# Patient Record
Sex: Male | Born: 1975
Health system: Southern US, Community
[De-identification: ages and names within clinical notes are randomized; demographics above are authoritative.]

---

## 2014-10-01 DIAGNOSIS — M791 Myalgia: Secondary | ICD-10-CM | POA: Insufficient documentation

## 2014-10-01 DIAGNOSIS — R112 Nausea with vomiting, unspecified: Secondary | ICD-10-CM | POA: Insufficient documentation

## 2014-10-01 DIAGNOSIS — Z720 Tobacco use: Secondary | ICD-10-CM | POA: Insufficient documentation

## 2014-10-01 DIAGNOSIS — R197 Diarrhea, unspecified: Secondary | ICD-10-CM | POA: Insufficient documentation

## 2014-10-02 ENCOUNTER — Encounter (HOSPITAL_BASED_OUTPATIENT_CLINIC_OR_DEPARTMENT_OTHER): Payer: Self-pay | Admitting: Emergency Medicine

## 2014-10-02 ENCOUNTER — Emergency Department (HOSPITAL_BASED_OUTPATIENT_CLINIC_OR_DEPARTMENT_OTHER)
Admission: EM | Admit: 2014-10-02 | Discharge: 2014-10-02 | Disposition: A | Discharge status: Home / Self Care | Payer: Self-pay | Attending: Emergency Medicine | Admitting: Emergency Medicine

## 2014-10-02 DIAGNOSIS — R112 Nausea with vomiting, unspecified: Secondary | ICD-10-CM

## 2014-10-02 DIAGNOSIS — R197 Diarrhea, unspecified: Secondary | ICD-10-CM

## 2014-10-02 MED ORDER — ONDANSETRON 8 MG PO TBDP
8.0000 mg | ORAL_TABLET | Freq: Once | ORAL | Status: AC
  Administered 2014-10-02: 8 mg via ORAL
  Filled 2014-10-02: qty 1

## 2014-10-02 MED ORDER — KETOROLAC TROMETHAMINE 60 MG/2ML IM SOLN
60.0000 mg | Freq: Once | INTRAMUSCULAR | Status: AC
  Administered 2014-10-02: 60 mg via INTRAMUSCULAR
  Filled 2014-10-02: qty 2

## 2014-10-02 MED ORDER — DICYCLOMINE HCL 10 MG/ML IM SOLN
20.0000 mg | Freq: Once | INTRAMUSCULAR | Status: AC
  Administered 2014-10-02: 20 mg via INTRAMUSCULAR
  Filled 2014-10-02: qty 2

## 2014-10-02 NOTE — ED Notes (Addendum)
Patient reports body aches which began around 2100 tonight.  Reports diarrhea, vomiting x 2, and chills.

## 2014-10-02 NOTE — ED Provider Notes (Signed)
CSN: [id number]     Arrival date & time 10/01/14  2351 History   First MD Initiated Contact with Patient 10/02/14 0105     Chief Complaint  Patient presents with  . Generalized Body Aches     (Consider location/radiation/quality/duration/timing/severity/associated sxs/prior Treatment) Patient is a 39 y.o. male presenting with vomiting. The history is provided by the patient.  Emesis Severity:  Mild Timing:  Intermittent Quality:  Stomach contents Progression:  Unchanged Chronicity:  New Recent urination:  Normal Context: not post-tussive   Relieved by:  Nothing Worsened by:  Nothing tried Ineffective treatments:  None tried Associated symptoms: diarrhea and myalgias   Associated symptoms: no fever   Diarrhea:    Quality:  Watery   Severity:  Moderate   Timing:  Intermittent   Progression:  Unchanged Risk factors: sick contacts   Risk factors: no alcohol use     History reviewed. No pertinent past medical history. History reviewed. No pertinent past surgical history. History reviewed. No pertinent family history. History  Substance Use Topics  . Smoking status: Current Every Day Smoker -- 5.00 packs/day    Types: Cigars  . Smokeless tobacco: Not on file  . Alcohol Use: No    Review of Systems  Constitutional: Negative for fever.  Gastrointestinal: Positive for vomiting and diarrhea.  Musculoskeletal: Positive for myalgias.  All other systems reviewed and are negative.     Allergies  Review of patient's allergies indicates no known allergies.  Home Medications   Prior to Admission medications   Not on File   BP 145/92 mmHg  Pulse 84  Temp(Src) 99.1 F (37.3 C) (Oral)  Resp 16  Ht 5\' 10"  (1.778 m)  Wt 140 lb (63.504 kg)  BMI 20.09 kg/m2  SpO2 100% Physical Exam  Constitutional: He is oriented to person, place, and time. He appears well-developed and well-nourished. No distress.  HENT:  Head: Normocephalic and atraumatic.  Mouth/Throat: Oropharynx  is clear and moist.  Eyes: Conjunctivae are normal. Pupils are equal, round, and reactive to light.  Neck: Normal range of motion. Neck supple.  Cardiovascular: Normal rate, regular rhythm and intact distal pulses.   Pulmonary/Chest: Effort normal and breath sounds normal. No respiratory distress. He has no wheezes. He has no rales.  Abdominal: Soft. Bowel sounds are normal. He exhibits no mass. There is no tenderness. There is no rebound and no guarding.  Musculoskeletal: Normal range of motion.  Neurological: He is alert and oriented to person, place, and time.  Skin: Skin is warm and dry.  Psychiatric: He has a normal mood and affect.    ED Course  Procedures (including critical care time) Labs Review Labs Reviewed - No data to display  Imaging Review No results found.   EKG Interpretation None      MDM   Final diagnoses:  None   Viral n/v/d.  Treated and PO challenged successfully.  Follow up with your PMD.  Strict return precautions given   Anacarolina Evelyn, MD 10/02/14 0159

## 2014-10-02 NOTE — ED Notes (Signed)
C/o generalized body aches x 5 hours

## 2015-09-19 ENCOUNTER — Emergency Department (HOSPITAL_BASED_OUTPATIENT_CLINIC_OR_DEPARTMENT_OTHER): Payer: Self-pay

## 2015-09-19 ENCOUNTER — Encounter (HOSPITAL_BASED_OUTPATIENT_CLINIC_OR_DEPARTMENT_OTHER): Payer: Self-pay

## 2015-09-19 ENCOUNTER — Emergency Department (HOSPITAL_BASED_OUTPATIENT_CLINIC_OR_DEPARTMENT_OTHER)
Admission: EM | Admit: 2015-09-19 | Discharge: 2015-09-19 | Disposition: A | Discharge status: Home / Self Care | Payer: Self-pay | Attending: Emergency Medicine | Admitting: Emergency Medicine

## 2015-09-19 DIAGNOSIS — Z9889 Other specified postprocedural states: Secondary | ICD-10-CM

## 2015-09-19 DIAGNOSIS — F17210 Nicotine dependence, cigarettes, uncomplicated: Secondary | ICD-10-CM | POA: Insufficient documentation

## 2015-09-19 DIAGNOSIS — J039 Acute tonsillitis, unspecified: Secondary | ICD-10-CM | POA: Insufficient documentation

## 2015-09-19 LAB — CBC WITH DIFFERENTIAL/PLATELET
Basophils Absolute: 0 10*3/uL (ref 0.0–0.1)
Basophils Relative: 0 %
EOS ABS: 0.2 10*3/uL (ref 0.0–0.7)
Eosinophils Relative: 3 %
HCT: 41.1 % (ref 39.0–52.0)
Hemoglobin: 14.7 g/dL (ref 13.0–17.0)
Lymphocytes Relative: 26 %
Lymphs Abs: 1.7 10*3/uL (ref 0.7–4.0)
MCH: 32.4 pg (ref 26.0–34.0)
MCHC: 35.8 g/dL (ref 30.0–36.0)
MCV: 90.5 fL (ref 78.0–100.0)
Monocytes Absolute: 0.8 10*3/uL (ref 0.1–1.0)
Monocytes Relative: 12 %
Neutro Abs: 3.9 10*3/uL (ref 1.7–7.7)
Neutrophils Relative %: 59 %
PLATELETS: 147 10*3/uL — AB (ref 150–400)
RBC: 4.54 MIL/uL (ref 4.22–5.81)
RDW: 12.4 % (ref 11.5–15.5)
WBC: 6.5 10*3/uL (ref 4.0–10.5)

## 2015-09-19 LAB — RAPID STREP SCREEN (MED CTR MEBANE ONLY): STREPTOCOCCUS, GROUP A SCREEN (DIRECT): NEGATIVE

## 2015-09-19 LAB — BASIC METABOLIC PANEL
Anion gap: 4 — ABNORMAL LOW (ref 5–15)
BUN: 11 mg/dL (ref 6–20)
CALCIUM: 8.8 mg/dL — AB (ref 8.9–10.3)
CO2: 30 mmol/L (ref 22–32)
Chloride: 106 mmol/L (ref 101–111)
Creatinine, Ser: 1.67 mg/dL — ABNORMAL HIGH (ref 0.61–1.24)
GFR calc Af Amer: 57 mL/min — ABNORMAL LOW (ref >60)
GFR, EST NON AFRICAN AMERICAN: 49 mL/min — AB (ref >60)
Glucose, Bld: 87 mg/dL (ref 65–99)
POTASSIUM: 4.1 mmol/L (ref 3.5–5.1)
SODIUM: 140 mmol/L (ref 135–145)

## 2015-09-19 MED ORDER — CLINDAMYCIN HCL 300 MG PO CAPS: 300.0000 mg | ORAL_CAPSULE | Freq: Three times a day (TID) | ORAL | Status: DC

## 2015-09-19 MED ORDER — IOHEXOL 300 MG/ML  SOLN
60.0000 mL | Freq: Once | INTRAMUSCULAR | Status: AC | PRN
  Administered 2015-09-19: 60 mL via INTRAVENOUS

## 2015-09-19 MED ORDER — IBUPROFEN 800 MG PO TABS
800.0000 mg | ORAL_TABLET | Freq: Once | ORAL | Status: AC
  Administered 2015-09-19: 800 mg via ORAL
  Filled 2015-09-19: qty 1

## 2015-09-19 MED ORDER — IBUPROFEN 600 MG PO TABS: 600.0000 mg | ORAL_TABLET | Freq: Four times a day (QID) | ORAL | Status: DC | PRN

## 2015-09-19 NOTE — ED Notes (Signed)
States began having a sore throat and left ear pain since Monday, hard to swallow at times, able to tolerate PO fluids and some soft foods. Throat appears red and more swelling noted at left posterior area

## 2015-09-19 NOTE — ED Provider Notes (Signed)
CSN: [id number]     Arrival date & time 09/19/15  1033 History   First MD Initiated Contact with Patient 09/19/15 1100     Chief Complaint  Patient presents with  . Sore Throat  . Otalgia    HPI   Kyle Curtis is a 40 y.o. male with no pertinent PMH who presents to the ED with left sided throat and ear pain, which he states started on Monday and has been constant since that time. He reports swallowing exacerbates his pain, though notes he has been able to swallow and has not had difficulty handling his secretions. He has tried theraflu at home with no significant symptom relief. He denies fever, chills, congestion, cough. He states he has a history of tonsillar abscess, and notes his symptoms do not feel as severe.   History reviewed. No pertinent past medical history. History reviewed. No pertinent past surgical history. No family history on file. Social History  Substance Use Topics  . Smoking status: Current Every Day Smoker -- 5.00 packs/day    Types: Cigars  . Smokeless tobacco: None  . Alcohol Use: No     Review of Systems  Constitutional: Negative for fever and chills.  HENT: Positive for ear pain and sore throat. Negative for drooling and trouble swallowing.   Respiratory: Negative for shortness of breath.       Allergies  Review of patient's allergies indicates no known allergies.  Home Medications   Prior to Admission medications   Medication Sig Start Date End Date Taking? Authorizing Provider  clindamycin (CLEOCIN) 300 MG capsule Take 1 capsule (300 mg total) by mouth 3 (three) times daily. 09/19/15   Mady Gemma, PA-C  ibuprofen (ADVIL,MOTRIN) 600 MG tablet Take 1 tablet (600 mg total) by mouth every 6 (six) hours as needed. 09/19/15   Mady Gemma, PA-C    BP 152/103 mmHg  Pulse 62  Temp(Src) 98.2 F (36.8 C) (Oral)  Resp 16  Ht  (1.778 m)  Wt 61.236 kg  BMI 19.37 kg/m2  SpO2 100% Physical Exam  Constitutional: He is oriented  to person, place, and time. He appears well-developed and well-nourished. No distress.  HENT:  Head: Normocephalic and atraumatic.  Right Ear: Hearing, tympanic membrane, external ear and ear canal normal. Tympanic membrane is not erythematous.  Left Ear: Hearing, tympanic membrane, external ear and ear canal normal. Tympanic membrane is not erythematous.  Nose: Nose normal.  Mouth/Throat: Uvula is midline and mucous membranes are normal. Posterior oropharyngeal erythema present. No oropharyngeal exudate.  Left sided tonsillar hypertrophy and erythema.   Eyes: Conjunctivae, EOM and lids are normal. Pupils are equal, round, and reactive to light. Right eye exhibits no discharge. Left eye exhibits no discharge. No scleral icterus.  Neck: Normal range of motion. Neck supple.  Cardiovascular: Normal rate, regular rhythm, normal heart sounds, intact distal pulses and normal pulses.   Pulmonary/Chest: Effort normal and breath sounds normal. No respiratory distress. He has no wheezes. He has no rales.  Abdominal: Soft. Normal appearance and bowel sounds are normal. He exhibits no distension and no mass. There is no tenderness. There is no rigidity, no rebound and no guarding.  Musculoskeletal: Normal range of motion. He exhibits no edema or tenderness.  Neurological: He is alert and oriented to person, place, and time.  Skin: Skin is warm, dry and intact. No rash noted. He is not diaphoretic. No erythema. No pallor.  Psychiatric: He has a normal mood and affect. His speech  is normal and behavior is normal.  Nursing note and vitals reviewed.   ED Course  Procedures (including critical care time)  Labs Review Labs Reviewed  CBC WITH DIFFERENTIAL/PLATELET - Abnormal; Notable for the following:    Platelets 147 (*)    All other components within normal limits  BASIC METABOLIC PANEL - Abnormal; Notable for the following:    Creatinine, Ser 1.67 (*)    Calcium 8.8 (*)    GFR calc non Af Amer 49 (*)     GFR calc Af Amer 57 (*)    Anion gap 4 (*)    All other components within normal limits  RAPID STREP SCREEN (NOT AT [facility])  CULTURE, GROUP A STREP [facility])    Imaging Review Ct Soft Tissue Neck W Contrast  09/19/2015  CLINICAL DATA:  History of tonsillar inflammation. History of peritonsillar abscess drainage. Sore throat with LEFT ear pain. EXAM: CT NECK WITH CONTRAST TECHNIQUE: Multidetector CT imaging of the neck was performed using the standard protocol following the bolus administration of intravenous contrast. CONTRAST:  60mL OMNIPAQUE IOHEXOL 300 MG/ML  SOLN COMPARISON:  None. FINDINGS: Pharynx and larynx: LEFT palatine tonsillar enlargement. LEFT greater than RIGHT nasopharyngeal adenoidal enlargement. Findings consistent with tonsillitis and adenoidal inflammation. No tonsillar abscess. No peritonsillar abscess. Parapharyngeal fat is preserved. Salivary glands: Normal. Thyroid: Normal. Lymph nodes: No pathologic adenopathy. Vascular: Premature for age atherosclerosis RIGHT carotid bifurcation. Calcified and soft plaque without flow limiting stenosis. No similar findings on the LEFT. Limited intracranial: Negative. Visualized orbits: No abnormality. Mastoids and visualized paranasal sinuses: No acute sinus or mastoid disease. Skeleton: Cervical spondylosis. Central protrusion suspected at C4-C5. Upper chest: No lung nodule or air cyst formation. IMPRESSION: Findings consistent with left-sided tonsillitis and adenitis. No tonsillar or peritonsillar abscess. Premature atherosclerosis at the RIGHT carotid bifurcation. No flow-limiting stenosis, but calcific and soft plaque indicates increased risk for cerebral infarction. Electronically Signed   By: Elsie Stain M.D.   On: 09/19/2015 13:19   I have personally reviewed and evaluated these images and lab results as part of my medical decision-making.   EKG Interpretation None      MDM   Final diagnoses:  Tonsillitis    40 year old male  presents with left sided sore throat and ear pain since Monday. Patient is afebrile. Vital signs stable. TMs clear bilaterally. Left tonsillar erythema and hypertrophy present on exam. Patient handling secretions well. CBC negative for leukocytosis or anemia. BMP remarkable for creatinine 1.67. Rapid strep negative. Strep culture ordered. CT soft tissue neck remarkable for left-sided tonsillitis and adenitis, no tonsillar or peritonsillar abscess, premature atherosclerosis of the right carotid bifurcation. Spoke with patient regarding results. Will treat with antibiotic given unilateral tonsillitis. Patient to follow up with PCP for further evaluation and management of renal function and atherosclerosis. Return precautions discussed. Patient verbalizes his understanding and is in agreement with plan.  BP 152/103 mmHg  Pulse 62  Temp(Src) 98.2 F (36.8 C) (Oral)  Resp 16  Ht  (1.778 m)  Wt 61.236 kg  BMI 19.37 kg/m2  SpO2 100%    Mady Gemma, PA-C 09/19/15 1614  Rolan Bucco, MD 09/20/15 1525

## 2015-09-19 NOTE — ED Notes (Addendum)
Patient here with earache and sore throat x 1 week, reports that he feels as if his throat is swollen. No distress. Left tonsil swelling and redness noted

## 2015-09-19 NOTE — ED Notes (Signed)
Saline Lock placed rt ac for CT procedure

## 2015-09-19 NOTE — Discharge Instructions (Signed)
1. Medications: clindamycin, ibuprofen, usual home medications 2. Treatment: rest, drink plenty of fluids 3. Follow Up: please followup with your primary doctor for discussion of your diagnoses and further evaluation after today's visit; if you do not have a primary care doctor use the resource guide provided to find one; please return to the ER for high fever, difficulty swallowing or handling your secretions, new or worsening symptoms   Tonsillitis Tonsillitis is an infection of the throat that causes the tonsils to become red, tender, and swollen. Tonsils are collections of lymphoid tissue at the back of the throat. Each tonsil has crevices (crypts). Tonsils help fight nose and throat infections and keep infection from spreading to other parts of the body for the first 18 months of life.  CAUSES Sudden (acute) tonsillitis is usually caused by infection with streptococcal bacteria. Long-lasting (chronic) tonsillitis occurs when the crypts of the tonsils become filled with pieces of food and bacteria, which makes it easy for the tonsils to become repeatedly infected. SYMPTOMS  Symptoms of tonsillitis include:  A sore throat, with possible difficulty swallowing.  White patches on the tonsils.  Fever.  Tiredness.  New episodes of snoring during sleep, when you did not snore before.  Small, foul-smelling, yellowish-white pieces of material (tonsilloliths) that you occasionally cough up or spit out. The tonsilloliths can also cause you to have bad breath. DIAGNOSIS Tonsillitis can be diagnosed through a physical exam. Diagnosis can be confirmed with the results of lab tests, including a throat culture. TREATMENT  The goals of tonsillitis treatment include the reduction of the severity and duration of symptoms and prevention of associated conditions. Symptoms of tonsillitis can be improved with the use of steroids to reduce the swelling. Tonsillitis caused by bacteria can be treated with  antibiotic medicines. Usually, treatment with antibiotic medicines is started before the cause of the tonsillitis is known. However, if it is determined that the cause is not bacterial, antibiotic medicines will not treat the tonsillitis. If attacks of tonsillitis are severe and frequent, your health care provider may recommend surgery to remove the tonsils (tonsillectomy). HOME CARE INSTRUCTIONS   Rest as much as possible and get plenty of sleep.  Drink plenty of fluids. While the throat is very sore, eat soft foods or liquids, such as sherbet, soups, or instant breakfast drinks.  Eat frozen ice pops.  Gargle with a warm or cold liquid to help soothe the throat. Mix 1/4 teaspoon of salt and 1/4 teaspoon of baking soda in 8 oz of water. SEEK MEDICAL CARE IF:   Large, tender lumps develop in your neck.  A rash develops.  A green, yellow-brown, or bloody substance is coughed up.  You are unable to swallow liquids or food for 24 hours.  You notice that only one of the tonsils is swollen. SEEK IMMEDIATE MEDICAL CARE IF:   You develop any new symptoms such as vomiting, severe headache, stiff neck, chest pain, or trouble breathing or swallowing.  You have severe throat pain along with drooling or voice changes.  You have severe pain, unrelieved with recommended medications.  You are unable to fully open the mouth.  You develop redness, swelling, or severe pain anywhere in the neck.  You have a fever. MAKE SURE YOU:   Understand these instructions.  Will watch your condition.  Will get help right away if you are not doing well or get worse.   This information is not intended to replace advice given to you by your health  care provider. Make sure you discuss any questions you have with your health care provider.   Document Released: 04/19/2005 Document Revised: 07/31/2014 Document Reviewed: 12/27/2012 Elsevier Interactive Patient Education 2016 [employer].   Emergency  Department Resource Guide 1) Find a Doctor and Pay Out of Pocket Although you won't have to find out who is covered by your insurance plan, it is a good idea to ask around and get recommendations. You will then need to call the office and see if the doctor you have chosen will accept you as a new patient and what types of options they offer for patients who are self-pay. Some doctors offer discounts or will set up payment plans for their patients who do not have insurance, but you will need to ask so you aren't surprised when you get to your appointment.  2) Contact Your Local Health Department Not all health departments have doctors that can see patients for sick visits, but many do, so it is worth a call to see if yours does. If you don't know where your local health department is, you can check in your phone book. The CDC also has a tool to help you locate your state's health department, and many state websites also have listings of all of their local health departments.  3) Find a Walk-in Clinic If your illness is not likely to be very severe or complicated, you may want to try a walk in clinic. These are popping up all over the country in pharmacies, drugstores, and shopping centers. They're usually staffed by nurse practitioners or physician assistants that have been trained to treat common illnesses and complaints. They're usually fairly quick and inexpensive. However, if you have serious medical issues or chronic medical problems, these are probably not your best option.  No Primary Care Doctor: - Call Health Connect at  [phone] - they can help you locate a primary care doctor that  accepts your insurance, provides certain services, etc. - Physician Referral Service- [phone]  Chronic Pain Problems: Organization         Address  Phone   Notes  [facility]  [phone] Patients need to be referred by their primary care doctor.   Medication  Assistance: Organization         Address  Phone   Notes  [facility] Medication [facility] [address] [place] [place], [place] [address] [phone] --Must be a resident of [facility] -- Must have NO insurance coverage whatsoever (no Medicaid/ Medicare, etc.) -- The pt. MUST have a primary care doctor that directs their care regularly and follows them in the community   [facility]  [phone]   [employer]  [phone]    Agencies that provide inexpensive medical care: Organization         Address  Phone   Notes  [facility]  [phone]   [facility]    [phone]   [facility] [address] [place], [place] [address] [phone]   [facility] [address] [place], [place] [phone]   Planned Parenthood    [phone]   [facility]    [phone]   Community Health and [facility]  [address], [place] Phone:  [phone], Fax:  [phone] Hours of Operation:  9 am - 6 pm, M-F.  Also accepts Medicaid/Medicare and self-pay.  [facility]  [address], [place] Phone: [phone], Fax: [phone]. Hours of Operation:  8:30 am - 5:30 pm, M-F.  Also accepts Medicaid and self-pay.  [facility] [address] [place] Phone: [phone]   [place], [place], [place] [phone], Ext. [phone] Mondays & Thursdays: 7-9 AM.  First 15 patients are seen on a first come, first serve basis.    [place] Providers:  Organization         Address  Phone   Notes  [facility] [address], [place] [phone] Also accepts self-pay patients.  [facility] [address], [place]  [phone]   [place], [address], [place]  [phone]   [facility] [address] [place] [phone]   Lucianne Lei [address], [place]   [phone] Only accepts [place] Access [place] patients after they have their name applied to their card.   Self-Pay (no insurance) in [facility]:  Organization         Address  Phone   Notes  Sickle Cell Patients, [facility] Internal Medicine [place] [phone]   [facility] [place] [phone]   [facility] [place]  [place], [place], [place] [phone]   [facility]/Dr. Osei-Bonsu  [address] [place] or [address], [place] [phone] Phone number for both [place] and [place] locations is the same.  Urgent Medical and [facility] [address] [place] [phone]   [facility] [address], [place] or [address] [phone]   [facility] [address] [place] [phone], phone; [phone], fax Sees patients 1st and 3rd Saturday of every month.  Must not qualify for public or private insurance (i.e. Medicaid, Medicare, [place] Health Choice, Veterans' Benefits)  Household income should be no more than 200% of the poverty level The clinic cannot treat you if you are pregnant or think you are pregnant  Sexually transmitted diseases are not treated at the clinic.    Dental Care: Organization         Address  Phone  Notes  [facility] [place] [phone] Accepts children up to age 14 who are enrolled in [place] or [place]; pregnant women with a Medicaid card; and children who have applied for Medicaid or [place] Health Choice, but were declined, whose parents can pay a reduced fee at time of service.  [facility]  Department of [facility]  [address], [place] [phone] Accepts children up to age 71 who are enrolled in [place] or [place]; pregnant women with a Medicaid card; and children who have applied for Medicaid or [place] Health Choice, but were declined, whose parents can pay a reduced fee at time of service.  [facility]  [place] [phone] Patients are seen by appointment only. Walk-ins are not accepted. [place] will see patients 60 years of age and older. Monday - Tuesday (8am-5pm) Most Wednesdays (8:30-5pm) $30 per visit, cash only  [place] [facility]  [address], [facility] [phone] Patients are seen by appointment only. Walk-ins are not accepted. [place] will see patients 46 years of age and older. One Wednesday Evening (Monthly: Volunteer Based).  $30 per visit, cash only  [place]  [phone] for adults; Children under age 71, call Graduate Pediatric Dentistry at [phone]. Children aged 62-14, please call [phone] to request a pediatric application.  Dental services are provided in all areas of dental care including fillings, crowns and bridges, complete and partial dentures, implants, gum treatment, root canals, and extractions. Preventive care is also provided. Treatment is provided to both adults and children. Patients are selected via a lottery and there is often a waiting list.   [facility] [address], [place]  [phone] [email or web address]   [facility] [address] [place], [place] [phone], Ext. [phone] Second and Fourth Thursday of each month, opens at 6:30 AM; Clinic ends at 9 AM.  Patients are seen on a first-come first-served basis, and a limited number are seen during each clinic.   [facility]  [address] Hillard Danker [place], [place] [phone]    Eligibility Requirements You must have lived in [place], [place], or [place] counties for at least the last three months.   You cannot be eligible for state or federal sponsored [employer], including [employer], [place], or [employer].   You generally cannot be eligible for healthcare insurance through your employer.    How to apply: Eligibility screenings are held every Tuesday and Wednesday afternoon from 1:00 pm until 4:00 pm. You do not need an appointment for the interview!  [facility] [address] [place], [place]   [place]  [place] Department  [place]  [phone]    Behavioral Health Resources in the Community: Intensive Outpatient Programs Organization         Address  Phone  Notes  [place]. [address] [place], [place] [phone]   [facility] Outpatient [address], [place], [place]   [facility] [address], [place], [place]   [place] [address]  [place], [place] or [phone]   Substance Abuse Resources Organization         Address  Phone  Notes  [facility] Drug Services  [phone]   [place]  [phone]   [facility] [place] [facility]   Chinita Pester  [phone]   Residential & Outpatient Substance Abuse Program  [phone]   Psychological Services Organization         Address  Phone  Notes  [facility] [place]  [place]  [phone]   [place] [address], [place] [phone] or [phone]    Mobile Crisis Teams Organization         Address  Phone  Notes  [facility], Mobile Crisis Care Unit  [phone]   [facility]  [address]  [place], [place]   [place] [address], [place] [phone]    Self-Help/Support Groups Organization         Address  Phone             Notes  [facility] [place] - variety of support groups  336- I7437963 Call for more information  Narcotics Anonymous (NA), [facility] [address], [employer] [place] [place]  2 meetings at this location   Statistician         Address  Phone  Notes  [facility] [address],    [place]  [phone]   [facility]  [address] [place] [address], [place], [place] [phone]   [facility] [address] [place], [place] [phone] Admissions: 8am-3pm M-F  [facility] [address],    [place], [place] [phone]   [facility] [address] [place], [place], [place] [phone]   [facility] [address],  [place], [place] [phone]   [facility] - Intensive Outpatient [address]ure[address], [place], [place] [phone]   [facility] ([facility] [address] [place].,  [place], [place] [phone] or [phone]   [facility] ([facility]) [address] [place], [place] [phone] Accepts Medicaid  [facility] [place] [address],  [place] [phone] Substance Abuse/Addiction Treatment   [facility] Organization         Address  Phone  Notes  [facility]  [phone]   Angie Fava, PhD [address] Ervin Knack [place], [place]   [phone] or [phone]   [facility] Behavioral   [address] [place], [place] [phone]   [facility] [address] [place], [place] [phone] Insurance/Medicaid/sponsorship through [facility] [place] and Families [address]                                    [place], [place] [phone] Therapy/tele-psych/case    [facility] [address], [place] [phone]    Dr. Lolly Mustache  [phone]   [facility]  [employer] [facility] Dept. 1) [address] [place] 2) [address] [place] 3)  [address], [place] [phone]  [phone]   [facility] Child Abuse Hotline 681-769-8568 or [phone] (After Hours)

## 2015-09-22 LAB — CULTURE, GROUP A STREP (THRC)

## 2015-12-18 ENCOUNTER — Emergency Department (HOSPITAL_BASED_OUTPATIENT_CLINIC_OR_DEPARTMENT_OTHER)
Admission: EM | Admit: 2015-12-18 | Discharge: 2015-12-18 | Disposition: A | Discharge status: Home / Self Care | Payer: Self-pay | Attending: Emergency Medicine | Admitting: Emergency Medicine

## 2015-12-18 ENCOUNTER — Encounter (HOSPITAL_BASED_OUTPATIENT_CLINIC_OR_DEPARTMENT_OTHER): Payer: Self-pay | Admitting: Emergency Medicine

## 2015-12-18 DIAGNOSIS — R9431 Abnormal electrocardiogram [ECG] [EKG]: Secondary | ICD-10-CM | POA: Insufficient documentation

## 2015-12-18 DIAGNOSIS — R112 Nausea with vomiting, unspecified: Secondary | ICD-10-CM | POA: Insufficient documentation

## 2015-12-18 DIAGNOSIS — F17210 Nicotine dependence, cigarettes, uncomplicated: Secondary | ICD-10-CM | POA: Insufficient documentation

## 2015-12-18 DIAGNOSIS — R42 Dizziness and giddiness: Secondary | ICD-10-CM | POA: Insufficient documentation

## 2015-12-18 LAB — URINALYSIS, ROUTINE W REFLEX MICROSCOPIC
BILIRUBIN URINE: NEGATIVE
Glucose, UA: NEGATIVE mg/dL
Hgb urine dipstick: NEGATIVE
Ketones, ur: 15 mg/dL — AB
LEUKOCYTES UA: NEGATIVE
Nitrite: NEGATIVE
PROTEIN: NEGATIVE mg/dL
Specific Gravity, Urine: 1.019 (ref 1.005–1.030)
pH: 7 (ref 5.0–8.0)

## 2015-12-18 LAB — CBC
HCT: 44.8 % (ref 39.0–52.0)
Hemoglobin: 15.9 g/dL (ref 13.0–17.0)
MCH: 32.1 pg (ref 26.0–34.0)
MCHC: 35.5 g/dL (ref 30.0–36.0)
MCV: 90.3 fL (ref 78.0–100.0)
PLATELETS: 177 10*3/uL (ref 150–400)
RBC: 4.96 MIL/uL (ref 4.22–5.81)
RDW: 12.2 % (ref 11.5–15.5)
WBC: 11.4 10*3/uL — AB (ref 4.0–10.5)

## 2015-12-18 LAB — BASIC METABOLIC PANEL
Anion gap: 9 (ref 5–15)
BUN: 10 mg/dL (ref 6–20)
CHLORIDE: 105 mmol/L (ref 101–111)
CO2: 25 mmol/L (ref 22–32)
CREATININE: 1.27 mg/dL — AB (ref 0.61–1.24)
Calcium: 9.1 mg/dL (ref 8.9–10.3)
GFR calc Af Amer: >60 mL/min (ref >60)
GFR calc non Af Amer: >60 mL/min (ref >60)
GLUCOSE: 122 mg/dL — AB (ref 65–99)
Potassium: 3.4 mmol/L — ABNORMAL LOW (ref 3.5–5.1)
Sodium: 139 mmol/L (ref 135–145)

## 2015-12-18 LAB — CBG MONITORING, ED: Glucose-Capillary: 112 mg/dL — ABNORMAL HIGH (ref 65–99)

## 2015-12-18 LAB — TROPONIN I
Troponin I: <0.03 ng/mL (ref <0.031)
Troponin I: <0.03 ng/mL (ref <0.031)

## 2015-12-18 LAB — HEPATIC FUNCTION PANEL
ALK PHOS: 60 U/L (ref 38–126)
ALT: 16 U/L — ABNORMAL LOW (ref 17–63)
AST: 22 U/L (ref 15–41)
Albumin: 4.4 g/dL (ref 3.5–5.0)
BILIRUBIN INDIRECT: 0.6 mg/dL (ref 0.3–0.9)
Bilirubin, Direct: 0.2 mg/dL (ref 0.1–0.5)
Total Bilirubin: 0.8 mg/dL (ref 0.3–1.2)
Total Protein: 7.5 g/dL (ref 6.5–8.1)

## 2015-12-18 LAB — LIPASE, BLOOD: Lipase: 17 U/L (ref 11–51)

## 2015-12-18 MED ORDER — SODIUM CHLORIDE 0.9 % IV BOLUS (SEPSIS)
1000.0000 mL | Freq: Once | INTRAVENOUS | Status: AC
  Administered 2015-12-18: 1000 mL via INTRAVENOUS

## 2015-12-18 MED ORDER — DIAZEPAM 5 MG PO TABS
5.0000 mg | ORAL_TABLET | Freq: Once | ORAL | Status: AC
  Administered 2015-12-18: 5 mg via ORAL
  Filled 2015-12-18: qty 1

## 2015-12-18 MED ORDER — MECLIZINE HCL 25 MG PO TABS
50.0000 mg | ORAL_TABLET | Freq: Once | ORAL | Status: AC
  Administered 2015-12-18: 50 mg via ORAL
  Filled 2015-12-18: qty 2

## 2015-12-18 MED ORDER — ONDANSETRON HCL 4 MG/2ML IJ SOLN
4.0000 mg | Freq: Once | INTRAMUSCULAR | Status: AC
  Administered 2015-12-18: 4 mg via INTRAVENOUS
  Filled 2015-12-18: qty 2

## 2015-12-18 MED ORDER — MECLIZINE HCL 25 MG PO TABS: 50.0000 mg | ORAL_TABLET | Freq: Three times a day (TID) | ORAL | Status: DC | PRN

## 2015-12-18 NOTE — Discharge Instructions (Signed)
Please read and follow all provided instructions.  Your diagnoses today include:  1. Vertigo   2. Abnormal EKG     Tests performed today include:  Blood counts and electrolytes - normal  Liver function and pancreas function - normal  Blood test for heart muscle damage - no sign of heart attack  EKG - abnormal, it is unclear what this means  Vital signs. See below for your results today.   Medications prescribed:   Meclizine - anti-dizziness medication  Take any prescribed medications only as directed.  Home care instructions:  Follow any educational materials contained in this packet.  Follow-up instructions: Please follow-up with your primary care provider in the next 3 days for further evaluation of your symptoms.   You should also follow-up with the cardiologist listed in the near future for evaluation of your abnormal EKG.   Return instructions:   Please return to the Emergency Department if you experience worsening symptoms.  Return if you have weakness in your arms or legs, slurred speech, trouble walking or talking, confusion, or worsening trouble with your balance.   Please return if you have any other emergent concerns.  Additional Information:  Your vital signs today were: BP 154/108 mmHg   Pulse 66   Temp(Src) 97.8 F (36.6 C) (Oral)   Resp 16   Ht 5\' 10"  (1.778 m)   Wt 61.236 kg   BMI 19.37 kg/m2   SpO2 100% If your blood pressure (BP) was elevated above 135/85 this visit, please have this repeated by your doctor within one month. --------------

## 2015-12-18 NOTE — ED Provider Notes (Signed)
CSN: [id number]     Arrival date & time 12/18/15  1607 History   First MD Initiated Contact with Patient 12/18/15 1615     Chief Complaint  Patient presents with  . Dizziness     (Consider location/radiation/quality/duration/timing/severity/associated sxs/prior Treatment) HPI Comments: Patient's presents with acute onset of dizziness described as room spinning last night. This was associated shortly afterwards with diaphoresis and vomiting. In addition, he developed lower abdominal pain and wife states that when he went to bed last night he was holding his lower stomach. He continued to have symptoms this morning. Blood pressure was noted be elevated by family members. Symptoms did not improve this afternoon, prompting ED visit. Patient denies having any chest pain. He has epigastric pain today with vomiting and retching. No history of hypertension, diabetes, high cholesterol. Patient does smoke. Of note, patient was noted to have premature atherosclerosis on a neck CT performed in 08/2015 when he had a sore throat.  Patient is a 40 y.o. male presenting with dizziness. The history is provided by the patient.  Dizziness Associated symptoms: nausea and vomiting   Associated symptoms: no blood in stool, no chest pain, no headaches and no shortness of breath     History reviewed. No pertinent past medical history. History reviewed. No pertinent past surgical history. History reviewed. No pertinent family history. Social History  Substance Use Topics  . Smoking status: Current Every Day Smoker -- 5.00 packs/day    Types: Cigars  . Smokeless tobacco: None  . Alcohol Use: No    Review of Systems  Constitutional: Positive for diaphoresis. Negative for fever and appetite change.  HENT: Negative for rhinorrhea and sore throat.   Eyes: Negative for redness.  Respiratory: Negative for cough and shortness of breath.   Cardiovascular: Negative for chest pain.  Gastrointestinal: Positive for  nausea, vomiting and abdominal pain. Negative for blood in stool.       Negative for hematemesis  Genitourinary: Negative for dysuria.  Musculoskeletal: Negative for myalgias.  Skin: Negative for rash.  Neurological: Positive for dizziness and light-headedness. Negative for seizures, syncope, speech difficulty and headaches.    Allergies  Review of patient's allergies indicates no known allergies.  Home Medications   Prior to Admission medications   Medication Sig Start Date End Date Taking? Authorizing Provider  clindamycin (CLEOCIN) 300 MG capsule Take 1 capsule (300 mg total) by mouth 3 (three) times daily. 09/19/15   Mady Gemma, PA-C  ibuprofen (ADVIL,MOTRIN) 600 MG tablet Take 1 tablet (600 mg total) by mouth every 6 (six) hours as needed. 09/19/15   Dorise Hiss Westfall, PA-C   BP 176/100 mmHg  Pulse 64  Temp(Src) 97.7 F (36.5 C) (Oral)  Resp 18  Ht  (1.778 m)  Wt 61.236 kg  BMI 19.37 kg/m2  SpO2 100%   Physical Exam  Constitutional: He is oriented to person, place, and time. He appears well-developed and well-nourished.  HENT:  Head: Normocephalic and atraumatic.  Right Ear: Tympanic membrane, external ear and ear canal normal.  Left Ear: Tympanic membrane, external ear and ear canal normal.  Nose: Nose normal.  Mouth/Throat: Uvula is midline, oropharynx is clear and moist and mucous membranes are normal. Mucous membranes are not dry.  Eyes: Conjunctivae and lids are normal. Pupils are equal, round, and reactive to light. Right eye exhibits nystagmus (rightward, horizontal). Left eye exhibits nystagmus (rightward, horizontal).  Neck: Trachea normal and normal range of motion. Neck supple. Normal carotid pulses and no JVD  present. No muscular tenderness present. Carotid bruit is not present. No tracheal deviation present.  Cardiovascular: Normal rate, regular rhythm, S1 normal, S2 normal, normal heart sounds and intact distal pulses.  Exam reveals no distant  heart sounds and no decreased pulses.   No murmur heard. Pulmonary/Chest: Effort normal and breath sounds normal. No respiratory distress. He has no wheezes. He exhibits no tenderness.  Abdominal: Soft. Normal aorta and bowel sounds are normal. There is no tenderness. There is no rebound and no guarding.  Musculoskeletal: Normal range of motion. He exhibits no edema.       Cervical back: He exhibits normal range of motion, no tenderness and no bony tenderness.  Neurological: He is alert and oriented to person, place, and time. He has normal strength and normal reflexes. No cranial nerve deficit or sensory deficit. He exhibits normal muscle tone. He displays a negative Romberg sign. Coordination and gait normal. GCS eye subscore is 4. GCS verbal subscore is 5. GCS motor subscore is 6.  Skin: Skin is warm and dry. He is not diaphoretic. No cyanosis. No pallor.  Psychiatric: He has a normal mood and affect.  Nursing note and vitals reviewed.   ED Course  Procedures (including critical care time) Labs Review Labs Reviewed  BASIC METABOLIC PANEL - Abnormal; Notable for the following:    Potassium 3.4 (*)    Glucose, Bld 122 (*)    Creatinine, Ser 1.27 (*)    All other components within normal limits  CBC - Abnormal; Notable for the following:    WBC 11.4 (*)    All other components within normal limits  URINALYSIS, ROUTINE W REFLEX MICROSCOPIC (NOT AT [facility]) - Abnormal; Notable for the following:    Ketones, ur 15 (*)    All other components within normal limits  HEPATIC FUNCTION PANEL - Abnormal; Notable for the following:    ALT 16 (*)    All other components within normal limits  CBG MONITORING, ED - Abnormal; Notable for the following:    Glucose-Capillary 112 (*)    All other components within normal limits  TROPONIN I  LIPASE, BLOOD  TROPONIN I     EKG Interpretation   Date/Time:  Saturday Dec 18 2015 16:33:17 EDT Ventricular Rate:  63 PR Interval:  147 QRS Duration:  100 QT Interval:  430 QTC Calculation: 440 R Axis:   81 Text Interpretation:  Sinus rhythm mild ST elevation anteriorly.  No old  tracing to compare Confirmed by BELFI  MD, MELANIE [phone]([id number]) on 12/18/2015  5:13:21 PM       4:22 PM Patient seen and examined. EKG received. Patient actively vomiting. Work-up initiated. Medications ordered. Symptoms are more vertiginous in nature. Dizziness and lower abd pain, diaphoresis only last night. Some upper abd pain today in the epigastrium when he is vomiting. Will discuss EKG with Dr. Fredderick PhenixBelfi. He is not giving a story that is suspicious for ACS. This sounds like vertigo, with vomiting and epigastric pain 2/2 vomiting.   Vital signs reviewed and are as follows: BP 176/100 mmHg  Pulse 64  Temp(Src) 97.7 F (36.5 C) (Oral)  Resp 18  Ht 5\' 10"  (1.778 m)  Wt 61.236 kg  BMI 19.37 kg/m2  SpO2 100%  4:33 PM Discussed EKG with Dr. Fredderick PhenixBelfi. Will repeat EKG in 10 minutes.   4:42 PM Patient seen by Dr. Fredderick PhenixBelfi. Agrees no STEMI given story. Awaiting troponin.   4:58 PM Trop neg. Awaiting completion of work-up.   6:11 PM Patient  re-evaluated. He is more comfortable. He can ambulate but is unsteady on his feet. He feels more dizziness when he stands up or sits on the edge of the bed.   6:27 PM Discussed with Dr. Fredderick Phenix.   8:39 PM Patient with steady improvement in the emergency department. He was ambulatory in the hall without difficulty. He does have some residual dizziness. Patient has had second negative troponin.  We discussed at length that symptoms are likely 2/2 peripheral vertigo but this can be very difficult to distinguish sometimes between central vertigo or stroke. Patient counseled to return immediately if they have weakness in their arms or legs, slurred speech, trouble walking or talking, confusion, trouble with their balance, or if they have any other concerns. Patient verbalizes understanding and agrees with plan.   We also discussed abnormal EKG  and that this may be chronic however may also indicate possible irritation of the heart. Patient does have some risk factors. Strongly encouraged PCP/cardiology follow-up. Referrals given. Patient was counseled to return with severe chest pain, especially if the pain is crushing or pressure-like and spreads to the arms, back, neck, or jaw, or if they have sweating, nausea, or shortness of breath with the pain. They were encouraged to call 911 with these symptoms.   The patient verbalized understanding and agreed.     MDM   Final diagnoses:  Vertigo  Abnormal EKG   Vertigo: Patient with nystagmus, sensation of movement, ability to ambulate. Improved in emergency department for treatment. Suspect benign peripheral vertigo. The stroke is considered, however patient does not have any additional neurological deficits. He does have some risk factors including carotid calcifications noted on previous CT. No carotid bruit today. Do not feel the patient needs admission for TIA/stroke workup at this time. Do not feel that brain imaging is indicated at this time.  Abnormal EKG: Patient evaluated for atypical angina type symptoms. Patient has not had any recent chest pain or shortness of breath. Do not feel that EKG findings are suggestive of Wellen's without any preceding chest symptoms. Feel that PCP/cardiology evaluation is likely indicated. Troponin neg x 2. EKG non-evolving.    Renne Crigler, PA-C 12/18/15 2044  Rolan Bucco, MD 12/18/15 2133

## 2015-12-18 NOTE — ED Notes (Signed)
Patient states that he started to have some lower abdominal pain last night. He then became dizzy, weak and throwing up

## 2016-02-14 ENCOUNTER — Emergency Department (HOSPITAL_BASED_OUTPATIENT_CLINIC_OR_DEPARTMENT_OTHER)
Admission: EM | Admit: 2016-02-14 | Discharge: 2016-02-14 | Disposition: A | Discharge status: Home / Self Care | Payer: Self-pay | Attending: Emergency Medicine | Admitting: Emergency Medicine

## 2016-02-14 ENCOUNTER — Encounter (HOSPITAL_BASED_OUTPATIENT_CLINIC_OR_DEPARTMENT_OTHER): Payer: Self-pay

## 2016-02-14 DIAGNOSIS — F17210 Nicotine dependence, cigarettes, uncomplicated: Secondary | ICD-10-CM | POA: Insufficient documentation

## 2016-02-14 DIAGNOSIS — K0889 Other specified disorders of teeth and supporting structures: Secondary | ICD-10-CM

## 2016-02-14 DIAGNOSIS — K029 Dental caries, unspecified: Secondary | ICD-10-CM | POA: Insufficient documentation

## 2016-02-14 MED ORDER — NAPROXEN 500 MG PO TABS: 500.0000 mg | ORAL_TABLET | Freq: Two times a day (BID) | ORAL | 0 refills | Status: DC

## 2016-02-14 MED ORDER — KETOROLAC TROMETHAMINE 60 MG/2ML IM SOLN
60.0000 mg | Freq: Once | INTRAMUSCULAR | Status: AC
Start: 2016-02-14 — End: 2016-02-14
  Administered 2016-02-14: 60 mg via INTRAMUSCULAR
  Filled 2016-02-14: qty 2

## 2016-02-14 MED ORDER — PENICILLIN V POTASSIUM 500 MG PO TABS: 500.0000 mg | ORAL_TABLET | Freq: Four times a day (QID) | ORAL | 0 refills | Status: AC

## 2016-02-14 MED ORDER — PENICILLIN V POTASSIUM 250 MG PO TABS
500.0000 mg | ORAL_TABLET | Freq: Once | ORAL | Status: AC
  Administered 2016-02-14: 500 mg via ORAL
  Filled 2016-02-14: qty 2

## 2016-02-14 MED FILL — NAPROXEN 500 MG TABLET: 500 | 15 days supply | Qty: 30 | Fill #0

## 2016-02-14 MED FILL — PENICILLIN VK 500 MG TABLET: 500 | 7 days supply | Qty: 28 | Fill #0

## 2016-02-14 NOTE — ED Triage Notes (Addendum)
C/o  Left lower toothache with swelling-started yesterday-NAD-pt on cell phone in triage-steady gait

## 2016-02-14 NOTE — Discharge Instructions (Signed)
Please follow up with a Dentist for further evaluation and management.   °Check this website for free, low-income or sliding scale dental services in [place]. www.[email or web address].us   °To find a dentist in the [place] or surrounding areas check this website: [email or web address] ° °

## 2016-02-14 NOTE — ED Provider Notes (Signed)
[facility]   CSN: [id number] Arrival date & time: 02/14/16  1149  First Provider Contact:  First MD Initiated Contact with Patient 02/14/16 1221        History   Chief Complaint Chief Complaint  Patient presents with  . Dental Pain    HPI Kyle Curtis is a 40 y.o. male.  Patient presents with vaginal onset, constant, moderate left lower dental pain that began yesterday. Associated symptoms include mild left lower jaw swelling. Denies fever, chills, nausea, vomiting, tongue swelling, or neck swelling. Patient reports taking Tylenol without relief. No aggravating factors. She does not have a dentist.       History reviewed. No pertinent past medical history.  There are no active problems to display for this patient.   History reviewed. No pertinent surgical history.     Home Medications    Prior to Admission medications   Medication Sig Start Date End Date Taking? Authorizing Provider  naproxen (NAPROSYN) 500 MG tablet Take 1 tablet (500 mg total) by mouth 2 (two) times daily. 02/14/16   Cheri Fowler, PA-C  penicillin v potassium (VEETID) 500 MG tablet Take 1 tablet (500 mg total) by mouth 4 (four) times daily. 02/14/16 02/21/16  Cheri Fowler, PA-C    Family History No family history on file.  Social History Social History  Substance Use Topics  . Smoking status: Current Every Day Smoker    Packs/day: 5.00    Types: Cigars  . Smokeless tobacco: Former Neurosurgeon  . Alcohol use No     Allergies   Review of patient's allergies indicates no known allergies.   Review of Systems Review of Systems  Constitutional: Negative for chills and fever.  HENT: Positive for dental problem and facial swelling. Negative for drooling and trouble swallowing.   Gastrointestinal: Negative for nausea and vomiting.     Physical Exam Updated Vital Signs BP (!) 167/120 (BP Location: Left Arm)   Pulse 78   Temp 99 F (37.2 C) (Oral)   Resp 18   Ht 5'  10" (1.778 m)   Wt 56.4 kg   SpO2 100%   BMI 17.84 kg/m   Physical Exam  Constitutional: He is oriented to person, place, and time. He appears well-developed and well-nourished.  HENT:  Head: Normocephalic and atraumatic.  Mouth/Throat: Uvula is midline, oropharynx is clear and moist and mucous membranes are normal. No trismus in the jaw. Abnormal dentition. Dental caries present.    Very poor dentition with missing teeth and cracked teeth throughout. No tongue swelling or facial swelling.  Left lower mandible with mild swelling and tender to palpation. No neck swelling or erythema. Airway patent. Patient tolerating secretions without difficulty.   Eyes: Conjunctivae are normal.  Neck: Normal range of motion. Neck supple.  No evidence of Ludwigs angina.  Cardiovascular: Normal rate, regular rhythm and normal heart sounds.   Pulmonary/Chest: Effort normal and breath sounds normal.  Abdominal: Bowel sounds are normal. He exhibits no distension.  Musculoskeletal:  Moves all extremities spontaneously.  Lymphadenopathy:    He has no cervical adenopathy.  Neurological: He is alert and oriented to person, place, and time.  Speech clear without dysarthria.   Skin: Skin is warm and dry.     ED Treatments / Results  Labs (all labs ordered are listed, but only abnormal results are displayed) Labs Reviewed - No data to display  EKG  EKG Interpretation None       Radiology No results found.  Procedures Procedures (including critical care time)  Medications Ordered in ED Medications  ketorolac (TORADOL) injection 60 mg (60 mg Intramuscular Given 02/14/16 1233)  penicillin v potassium (VEETID) tablet 500 mg (500 mg Oral Given 02/14/16 1233)     Initial Impression / Assessment and Plan / ED Course  I have reviewed the triage vital signs and the nursing notes.  Pertinent labs & imaging results that were available during my care of the patient were reviewed by me and considered  in my medical decision making (see chart for details).  Clinical Course  Value Comment By Time  Temp: 99 F (37.2 C) (Reviewed) Cheri Fowler, PA-C 07/24 1241  BP: (!) 167/120 This is likely secondary to pain. Patient does not have any severe headache, chest pain, shortness of breath or abdominal pain. Cheri Fowler, PA-C 07/24 1241   Suspect dental pain associated with dental infection and possible dental abscess with patient afebrile, non toxic appearing and swallowing secretions well. Doubt Ludwig angina or deep soft tissue infection.  I gave patient referral to dentist and stressed the importance of dental follow up for ultimate management of dental pain. Discussed return precautions.  Patient expresses understanding and agrees with plan.  I will also give penicillin VK and pain control.    Final Clinical Impressions(s) / ED Diagnoses   Final diagnoses:  Pain, dental    New Prescriptions New Prescriptions   NAPROXEN (NAPROSYN) 500 MG TABLET    Take 1 tablet (500 mg total) by mouth 2 (two) times daily.   PENICILLIN V POTASSIUM (VEETID) 500 MG TABLET    Take 1 tablet (500 mg total) by mouth 4 (four) times daily.     Cheri Fowler, PA-C 02/14/16 1300    Rolland Porter, MD 02/27/16 [phone]

## 2017-02-06 ENCOUNTER — Emergency Department (HOSPITAL_BASED_OUTPATIENT_CLINIC_OR_DEPARTMENT_OTHER): Payer: Self-pay

## 2017-02-06 ENCOUNTER — Emergency Department (HOSPITAL_BASED_OUTPATIENT_CLINIC_OR_DEPARTMENT_OTHER)
Admission: EM | Admit: 2017-02-06 | Discharge: 2017-02-06 | Disposition: A | Discharge status: Home / Self Care | Payer: Self-pay | Attending: Emergency Medicine | Admitting: Emergency Medicine

## 2017-02-06 ENCOUNTER — Encounter (HOSPITAL_BASED_OUTPATIENT_CLINIC_OR_DEPARTMENT_OTHER): Payer: Self-pay | Admitting: *Deleted

## 2017-02-06 DIAGNOSIS — Z23 Encounter for immunization: Secondary | ICD-10-CM | POA: Insufficient documentation

## 2017-02-06 DIAGNOSIS — Y999 Unspecified external cause status: Secondary | ICD-10-CM | POA: Insufficient documentation

## 2017-02-06 DIAGNOSIS — S20412A Abrasion of left back wall of thorax, initial encounter: Secondary | ICD-10-CM | POA: Insufficient documentation

## 2017-02-06 DIAGNOSIS — Y939 Activity, unspecified: Secondary | ICD-10-CM | POA: Insufficient documentation

## 2017-02-06 DIAGNOSIS — F17290 Nicotine dependence, other tobacco product, uncomplicated: Secondary | ICD-10-CM | POA: Insufficient documentation

## 2017-02-06 DIAGNOSIS — Y929 Unspecified place or not applicable: Secondary | ICD-10-CM | POA: Insufficient documentation

## 2017-02-06 DIAGNOSIS — S21339A Puncture wound without foreign body of unspecified front wall of thorax with penetration into thoracic cavity, initial encounter: Secondary | ICD-10-CM

## 2017-02-06 DIAGNOSIS — W3400XA Accidental discharge from unspecified firearms or gun, initial encounter: Secondary | ICD-10-CM

## 2017-02-06 MED ORDER — IBUPROFEN 400 MG PO TABS
600.0000 mg | ORAL_TABLET | Freq: Once | ORAL | Status: AC
  Administered 2017-02-06: 600 mg via ORAL
  Filled 2017-02-06: qty 1

## 2017-02-06 MED ORDER — TETANUS-DIPHTH-ACELL PERTUSSIS 5-2.5-18.5 LF-MCG/0.5 IM SUSP
0.5000 mL | Freq: Once | INTRAMUSCULAR | Status: AC
Start: 2017-02-06 — End: 2017-02-06
  Administered 2017-02-06: 0.5 mL via INTRAMUSCULAR
  Filled 2017-02-06: qty 0.5
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
## 2020-11-04 ENCOUNTER — Encounter (HOSPITAL_BASED_OUTPATIENT_CLINIC_OR_DEPARTMENT_OTHER): Payer: Self-pay | Admitting: Emergency Medicine

## 2020-11-04 ENCOUNTER — Other Ambulatory Visit (HOSPITAL_BASED_OUTPATIENT_CLINIC_OR_DEPARTMENT_OTHER): Payer: Self-pay

## 2020-11-04 ENCOUNTER — Other Ambulatory Visit: Payer: Self-pay

## 2020-11-04 ENCOUNTER — Emergency Department (HOSPITAL_BASED_OUTPATIENT_CLINIC_OR_DEPARTMENT_OTHER)
Admission: EM | Admit: 2020-11-04 | Discharge: 2020-11-04 | Disposition: A | Discharge status: Home / Self Care | Payer: BC Managed Care – PPO | Attending: Emergency Medicine | Admitting: Emergency Medicine

## 2020-11-04 DIAGNOSIS — F17290 Nicotine dependence, other tobacco product, uncomplicated: Secondary | ICD-10-CM | POA: Insufficient documentation

## 2020-11-04 DIAGNOSIS — K648 Other hemorrhoids: Secondary | ICD-10-CM | POA: Insufficient documentation

## 2020-11-04 DIAGNOSIS — K6289 Other specified diseases of anus and rectum: Secondary | ICD-10-CM | POA: Diagnosis present

## 2020-11-04 DIAGNOSIS — K645 Perianal venous thrombosis: Secondary | ICD-10-CM

## 2020-11-04 MED ORDER — NAPROXEN 500 MG PO TABS
500.0000 mg | ORAL_TABLET | ORAL | 0 refills | Status: DC
  Filled 2020-11-04: qty 30, 15d supply, fill #0

## 2020-11-04 MED ORDER — NITROGLYCERIN 0.4 % RE OINT
1.0000 "application " | TOPICAL_OINTMENT | Freq: Two times a day (BID) | RECTAL | 0 refills | Status: AC | PRN
  Filled 2020-11-04: qty 30, 15d supply, fill #0

## 2020-11-04 MED ORDER — DOCUSATE SODIUM 100 MG PO CAPS
100.0000 mg | ORAL_CAPSULE | Freq: Two times a day (BID) | ORAL | 0 refills | Status: AC
  Filled 2020-11-04: qty 100, 50d supply, fill #0

## 2020-11-04 MED ORDER — NAPROXEN 500 MG PO TABS: 500.0000 mg | ORAL_TABLET | Freq: Two times a day (BID) | ORAL | 0 refills | Status: DC | PRN

## 2020-11-04 MED ORDER — BENZOCAINE 20 % RE OINT
TOPICAL_OINTMENT | RECTAL | 0 refills | Status: AC | PRN
  Filled 2020-11-04: qty 28.4, fill #0

## 2020-11-04 NOTE — ED Triage Notes (Addendum)
Pt reports rectal pain, concern for for hemorrhoids after using restroom on Tuesday evening. Pt denies bleeding. Pt denies constipation

## 2020-11-04 NOTE — ED Provider Notes (Signed)
[facility] EMERGENCY DEPARTMENT Provider Note   CSN: [id number] Arrival date & time: 11/04/20  3149     History Chief Complaint  Patient presents with  . Rectal Pain    Kyle Curtis is a 45 y.o. male.  HPI     Tuesday had BM, was runny, has had rectal pain since then. No bleeding or constipation. Pain was 10/10 previously, was at work and pain was terrible.  Worse with palpation. Tried preparation H but not having improvement.   No hx of other anorectal disease. No fever/chills/n/v/abd pain.   History reviewed. No pertinent past medical history.  There are no problems to display for this patient.   History reviewed. No pertinent surgical history.     History reviewed. No pertinent family history.  Social History   Tobacco Use  . Smoking status: Current Every Day Smoker    Packs/day: 5.00    Types: Cigars  . Smokeless tobacco: Former Engineer, water Use Topics  . Alcohol use: No  . Drug use: No    Home Medications Prior to Admission medications   Medication Sig Start Date End Date Taking? Authorizing Provider  benzocaine (AMERICAINE) 20 % rectal ointment Place rectally every 4 (four) hours as needed for pain (use sparingling up to six times per day up to one week). 11/04/20  Yes Alvira Monday, MD  docusate sodium (COLACE) 100 MG capsule Take 1 capsule (100 mg total) by mouth 2 (two) times daily. 11/04/20  Yes Alvira Monday, MD  Nitroglycerin 0.4 % OINT Place 1 application rectally 2 (two) times daily as needed (apply pea sized amount twice per day). 11/04/20  Yes Alvira Monday, MD  naproxen (NAPROSYN) 500 MG tablet Take 1 tablet (500 mg total) by mouth twice daily as needed. 11/04/20   Alvira Monday, MD  naproxen (NAPROSYN) 500 MG tablet Take 1 tablet (500 mg total) by mouth 2 (two) times daily as needed. 11/04/20   Alvira Monday, MD    Allergies    Patient has no known allergies.  Review of Systems   Review of Systems   Constitutional: Negative for fever.  Respiratory: Negative for cough.   Gastrointestinal: Negative for abdominal pain, anal bleeding, blood in stool, diarrhea, nausea and vomiting.  Skin: Negative for wound.  Neurological: Negative for light-headedness.    Physical Exam Updated Vital Signs BP (!) 147/96 (BP Location: Right Arm)   Pulse 87   Temp 98 F (36.7 C) (Oral)   Resp 18   Ht 5\' 10"  (1.778 m)   Wt 54.9 kg   SpO2 100%   BMI 17.36 kg/m   Physical Exam Vitals and nursing note reviewed.  Constitutional:      General: He is not in acute distress.    Appearance: Normal appearance. He is not ill-appearing, toxic-appearing or diaphoretic.  HENT:     Head: Normocephalic.  Eyes:     Conjunctiva/sclera: Conjunctivae normal.  Cardiovascular:     Rate and Rhythm: Normal rate and regular rhythm.     Pulses: Normal pulses.  Pulmonary:     Effort: Pulmonary effort is normal. No respiratory distress.  Genitourinary:    Comments: 1.5cm bulging most consistent with thrombosed hemorrhoid, tender, no surrounding erythema, no abnormalities on digital exam, no fluctuance or drainage Musculoskeletal:        General: No deformity or signs of injury.     Cervical back: No rigidity.  Skin:    General: Skin is warm and dry.     Coloration:  Skin is not jaundiced or pale.  Neurological:     General: No focal deficit present.     Mental Status: He is alert and oriented to person, place, and time.     ED Results / Procedures / Treatments   Labs (all labs ordered are listed, but only abnormal results are displayed) Labs Reviewed - No data to display  EKG None  Radiology No results found.  Procedures Procedures   Medications Ordered in ED Medications - No data to display  ED Course  I have reviewed the triage vital signs and the nursing notes.  Pertinent labs & imaging results that were available during my care of the patient were reviewed by me and considered in my medical  decision making (see chart for details).    MDM Rules/Calculators/A&P                          45yo male presents with concern for rectal pain for 2 days.  Hx most consistent with thrombosed external hemorrhage. Do not feel findings consistent with abscess.  Given rx for supportive care as below and number for general surgery follow up .  Patient discharged in stable condition with understanding of reasons to return.   Final Clinical Impression(s) / ED Diagnoses Final diagnoses:  Thrombosed hemorrhoids    Rx / DC Orders ED Discharge Orders         Ordered    benzocaine (AMERICAINE) 20 % rectal ointment  Every 4 hours PRN        11/04/20 1017    docusate sodium (COLACE) 100 MG capsule  2 times daily        11/04/20 1017    Nitroglycerin 0.4 % OINT  2 times daily PRN        11/04/20 1017    naproxen (NAPROSYN) 500 MG tablet  2 times daily PRN        11/04/20 1017           Alvira Monday, MD 11/04/20 2326

## 2020-11-04 NOTE — ED Notes (Signed)
Reviewed all dc medications, pt verbalized understanding

## 2020-11-04 NOTE — Discharge Instructions (Addendum)
Continue using your Preparation H (hydrocortisone rectal cream 1-2.5%) apply sparingly up to 2 times per day in addition to the benzocaine rx (for numbing)  and the nitro ointment rx (also for pain/thrombosed hemorrhoid.)

## 2021-06-24 ENCOUNTER — Emergency Department (HOSPITAL_BASED_OUTPATIENT_CLINIC_OR_DEPARTMENT_OTHER): Payer: BC Managed Care – PPO

## 2021-06-24 ENCOUNTER — Other Ambulatory Visit: Payer: Self-pay

## 2021-06-24 ENCOUNTER — Encounter (HOSPITAL_BASED_OUTPATIENT_CLINIC_OR_DEPARTMENT_OTHER): Payer: Self-pay

## 2021-06-24 ENCOUNTER — Emergency Department (HOSPITAL_BASED_OUTPATIENT_CLINIC_OR_DEPARTMENT_OTHER)
Admission: EM | Admit: 2021-06-24 | Discharge: 2021-06-24 | Disposition: A | Discharge status: Home / Self Care | Payer: BC Managed Care – PPO | Attending: Emergency Medicine | Admitting: Emergency Medicine

## 2021-06-24 DIAGNOSIS — F17290 Nicotine dependence, other tobacco product, uncomplicated: Secondary | ICD-10-CM | POA: Diagnosis not present

## 2021-06-24 DIAGNOSIS — M25531 Pain in right wrist: Secondary | ICD-10-CM | POA: Diagnosis present

## 2021-06-24 DIAGNOSIS — W010XXA Fall on same level from slipping, tripping and stumbling without subsequent striking against object, initial encounter: Secondary | ICD-10-CM | POA: Diagnosis not present

## 2021-06-24 MED ORDER — NAPROXEN 500 MG PO TABS: 500.0000 mg | ORAL_TABLET | Freq: Two times a day (BID) | ORAL | 0 refills | Status: AC

## 2021-06-24 NOTE — ED Notes (Signed)
D/c paperwork reviewed with pt, including prescription. Pt educated on f/u on elevated BP, verbalized understanding. No questions or concerns at time of d/c.  Pt ambulatory to ED exit, NAD.

## 2021-06-24 NOTE — ED Triage Notes (Signed)
Pt states he slipped/fell 11/27-injured right wrist-NAD-steady gait

## 2021-06-24 NOTE — ED Provider Notes (Addendum)
[facility] EMERGENCY DEPARTMENT Provider Note   CSN: [id number] Arrival date & time: 06/24/21  1850     History Chief Complaint  Patient presents with   Wrist Injury    Kyle Curtis is a 45 y.o. male with past medical history who presents for evaluation of fall.  Slipped and fell 5 days ago.  Has had pain to radial aspect right wrist as well as hand since then.  No overlying swelling, redness, warmth.  Pain worse with movement.  Taking Tylenol without relief.  Denies hitting head, LOC or anticoagulation.  Fell on outstretched wrist.  No fever, chills.  Denies additional aggravating or alleviating factors.  History of obtained from patient and past medical records.  No interpreter used  HPI     History reviewed. No pertinent past medical history.  There are no problems to display for this patient.   History reviewed. No pertinent surgical history.     No family history on file.  Social History   Tobacco Use   Smoking status: Every Day    Types: Cigars   Smokeless tobacco: Never  Vaping Use   Vaping Use: Never used  Substance Use Topics   Alcohol use: No   Drug use: No    Home Medications Prior to Admission medications   Medication Sig Start Date End Date Taking? Authorizing Provider  naproxen (NAPROSYN) 500 MG tablet Take 1 tablet (500 mg total) by mouth 2 (two) times daily. 06/24/21  Yes Kweli Grassel A, PA-C  benzocaine ([place]ER[place]INE) 20 % rectal ointment Place rectally every 4 (four) hours as needed for pain (use sparingling up to six times per day up to one week). 11/04/20   Alvira Monday, MD  docusate sodium (COLACE) 100 MG capsule Take 1 capsule (100 mg total) by mouth 2 (two) times daily. 11/04/20   Alvira Monday, MD  Nitroglycerin 0.4 % OINT Place 1 application rectally 2 (two) times daily as needed (apply pea sized amount twice per day). 11/04/20   Alvira Monday, MD    Allergies    Banana  Review of Systems   Review of Systems   Constitutional: Negative.   HENT: Negative.    Respiratory: Negative.    Cardiovascular: Negative.   Gastrointestinal: Negative.   Genitourinary: Negative.   Musculoskeletal:        Right wrist pain, hand pain  Skin: Negative.   Neurological: Negative.   All other systems reviewed and are negative.  Physical Exam Updated Vital Signs BP (!) 153/96 (BP Location: Left Arm)   Pulse 92   Temp 98.2 F (36.8 C) (Oral)   Resp 18   Ht 5\' 10"  (1.778 m)   Wt 54.4 kg   SpO2 100%   BMI 17.22 kg/m   Physical Exam Vitals and nursing note reviewed.  Constitutional:      General: He is not in acute distress.    Appearance: He is well-developed. He is not ill-appearing, toxic-appearing or diaphoretic.  HENT:     Head: Normocephalic and atraumatic.  Eyes:     Pupils: Pupils are equal, round, and reactive to light.  Cardiovascular:     Rate and Rhythm: Normal rate and regular rhythm.     Pulses: Normal pulses.          Radial pulses are 2+ on the right side and 2+ on the left side.     Heart sounds: Normal heart sounds.  Pulmonary:     Effort: Pulmonary effort is normal. No respiratory distress.  Abdominal:     General: There is no distension.     Palpations: Abdomen is soft.  Musculoskeletal:        General: Normal range of motion.     Right wrist: Tenderness present.     Right hand: Tenderness and bony tenderness present.     Left hand: Normal.       Hands:     Cervical back: Normal range of motion and neck supple.     Comments: Diffuse tenderness right wrist, radial aspect > ulnar.  Some generalized tenderness metacarpals on the right.  Able to pronate, supinate, flex, extend, perform radial ulnar deviation without difficulty.  Tenderness over right scaphoid.  No bony tenderness to radius or ulna on right.  Skin:    General: Skin is warm and dry.  Neurological:     General: No focal deficit present.     Mental Status: He is alert and oriented to person, place, and time.     ED Results / Procedures / Treatments   Labs (all labs ordered are listed, but only abnormal results are displayed) Labs Reviewed - No data to display  EKG None  Radiology DG Wrist Complete Right  Result Date: 06/24/2021 CLINICAL DATA:  Recent fall with wrist pain EXAM: RIGHT WRIST - COMPLETE 3+ VIEW COMPARISON:  None. FINDINGS: There is no evidence of fracture or dislocation. There is no evidence of arthropathy or other focal bone abnormality. Soft tissues are unremarkable. IMPRESSION: Negative. Electronically Signed   By: Guadlupe Spanish M.D.   On: 06/24/2021 19:56   DG Hand Complete Right  Result Date: 06/24/2021 CLINICAL DATA:  Fall, right hand pain EXAM: RIGHT HAND - COMPLETE 3+ VIEW COMPARISON:  None. FINDINGS: No fracture or dislocation is seen. The joint spaces are preserved. The visualized soft tissues are unremarkable. IMPRESSION: Negative. Electronically Signed   By: Charline Bills M.D.   On: 06/24/2021 21:28    Procedures .Ortho Injury Treatment  Date/Time: 06/24/2021 10:29 PM Performed by: Linwood Dibbles, PA-C Authorized by: Linwood Dibbles, PA-C   Consent:    Consent obtained:  Verbal   Consent given by:  Patient   Risks discussed:  Fracture, nerve damage, restricted joint movement, vascular damage, stiffness, recurrent dislocation and irreducible dislocation   Alternatives discussed:  No treatment, alternative treatment and delayed treatmentInjury location: wrist Location details: right wrist Injury type: soft tissue Pre-procedure neurovascular assessment: neurovascularly intact Pre-procedure distal perfusion: normal Pre-procedure neurological function: normal Pre-procedure range of motion: normal  Anesthesia: Local anesthesia used: no  Patient sedated: NoImmobilization: splint Splint type: thumb spica Splint Applied by: ED Tech Supplies used: velcro. Post-procedure neurovascular assessment: post-procedure neurovascularly intact Post-procedure  distal perfusion: normal Post-procedure neurological function: normal Post-procedure range of motion: normal     Medications Ordered in ED Medications - No data to display  ED Course  I have reviewed the triage vital signs and the nursing notes.  Pertinent labs & imaging results that were available during my care of the patient were reviewed by me and considered in my medical decision making (see chart for details).  Here for evaluation of slip and fall subsequent right wrist pain from 5 days ago.  No obvious head injury.  He is neurovascularly intact.  No breaks in skin.  Tenderness to right scaphoid as well as some generalized tenderness to right metacarpals.  No obvious infectious process.  Nontender radius/ulna.  Imaging does not show any significant abnormality.  Given tenderness over scaphoid was placed in thumb  spica splint we will have him follow-up with PCP, Ortho, [facility] symptomatic management.  Return for new worsening symptoms  The patient has been appropriately medically screened and/or stabilized in the ED. I have low suspicion for any other emergent medical condition which would require further screening, evaluation or treatment in the ED or require inpatient management.  Patient is hemodynamically stable and in no acute distress.  Patient able to ambulate in department prior to ED.  Evaluation does not show acute pathology that would require ongoing or additional emergent interventions while in the emergency department or further inpatient treatment.  I have discussed the diagnosis with the patient and answered all questions.  Pain is been managed while in the emergency department and patient has no further complaints prior to discharge.  Patient is comfortable with plan discussed in room and is stable for discharge at this time.  I have discussed strict return precautions for returning to the emergency department.  Patient was encouraged to follow-up with PCP/specialist refer to at  discharge.     MDM Rules/Calculators/A&P                            Final Clinical Impression(s) / ED Diagnoses Final diagnoses:  Right wrist pain    Rx / DC Orders ED Discharge Orders          Ordered    naproxen (NAPROSYN) 500 MG tablet  2 times daily        06/24/21 2226             Latonya Nelon A, PA-C 06/24/21 2229    Juris Gosnell A, PA-C 06/24/21 2230    Alvira Monday, MD 06/25/21 1154

## 2021-06-24 NOTE — Discharge Instructions (Addendum)
Follow-up with primary care provider for repeat imaging if your symptoms do not resolve  Return for new or worsening symptoms

## 2021-06-27 ENCOUNTER — Encounter (HOSPITAL_BASED_OUTPATIENT_CLINIC_OR_DEPARTMENT_OTHER): Payer: Self-pay | Admitting: Emergency Medicine

## 2021-06-27 ENCOUNTER — Emergency Department (HOSPITAL_BASED_OUTPATIENT_CLINIC_OR_DEPARTMENT_OTHER)
Admission: EM | Admit: 2021-06-27 | Discharge: 2021-06-27 | Disposition: A | Discharge status: Home / Self Care | Payer: BC Managed Care – PPO | Attending: Emergency Medicine | Admitting: Emergency Medicine

## 2021-06-27 ENCOUNTER — Other Ambulatory Visit: Payer: Self-pay

## 2021-06-27 ENCOUNTER — Emergency Department (HOSPITAL_BASED_OUTPATIENT_CLINIC_OR_DEPARTMENT_OTHER): Payer: BC Managed Care – PPO

## 2021-06-27 DIAGNOSIS — F17210 Nicotine dependence, cigarettes, uncomplicated: Secondary | ICD-10-CM | POA: Diagnosis not present

## 2021-06-27 DIAGNOSIS — M79641 Pain in right hand: Secondary | ICD-10-CM | POA: Diagnosis present

## 2021-06-27 NOTE — Discharge Instructions (Signed)
Follow up with Dr. Yehuda Budd with orthopedics.  He is a hand specialist.  Suspicion that there might be some ligamentous injury to your right hand that was demonstrated on your last x-ray.  Please follow-up with orthopedic physician.  Remain in the splint.  Please call today to set up an appointment for this week or next week

## 2021-06-27 NOTE — ED Triage Notes (Signed)
Pt arrives pov, ambulatory to triage, endorses f/u visit to recheck right wrist injury.

## 2021-06-27 NOTE — ED Provider Notes (Signed)
[facility] EMERGENCY DEPARTMENT Provider Note   CSN: [id number] Arrival date & time: 06/27/21  0844     History Chief Complaint  Patient presents with   Follow-up    Kyle Curtis is a 45 y.o. male.  The history is provided by the patient.  Hand Pain This is a chronic problem. The current episode started more than 1 week ago. The problem occurs daily. The problem has not changed since onset.Nothing aggravates the symptoms. Nothing relieves the symptoms. He has tried nothing for the symptoms. The treatment provided no relief.      History reviewed. No pertinent past medical history.  There are no problems to display for this patient.   History reviewed. No pertinent surgical history.     History reviewed. No pertinent family history.  Social History   Tobacco Use   Smoking status: Every Day    Types: Cigars   Smokeless tobacco: Never  Vaping Use   Vaping Use: Never used  Substance Use Topics   Alcohol use: No   Drug use: No    Home Medications Prior to Admission medications   Medication Sig Start Date End Date Taking? Authorizing Provider  benzocaine ([place]ER[place]INE) 20 % rectal ointment Place rectally every 4 (four) hours as needed for pain (use sparingling up to six times per day up to one week). 11/04/20   Alvira Monday, MD  docusate sodium (COLACE) 100 MG capsule Take 1 capsule (100 mg total) by mouth 2 (two) times daily. 11/04/20   Alvira Monday, MD  naproxen (NAPROSYN) 500 MG tablet Take 1 tablet (500 mg total) by mouth 2 (two) times daily. 06/24/21   Henderly, Britni A, PA-C  Nitroglycerin 0.4 % OINT Place 1 application rectally 2 (two) times daily as needed (apply pea sized amount twice per day). 11/04/20   Alvira Monday, MD    Allergies    Banana  Review of Systems   Review of Systems  Musculoskeletal:  Positive for arthralgias. Negative for back pain, gait problem and joint swelling.  Skin:  Negative for color change and wound.   Neurological:  Negative for weakness and numbness.   Physical Exam Updated Vital Signs BP (!) 154/98 (BP Location: Left Arm)   Pulse 89   Temp 97.8 F (36.6 C) (Oral)   Resp 18   Ht 5\' 10"  (1.778 m)   Wt 54.4 kg   SpO2 100%   BMI 17.22 kg/m   Physical Exam Constitutional:      General: He is not in acute distress.    Appearance: He is not ill-appearing.  Musculoskeletal:        General: Tenderness present.     Comments: Tenderness over the right hand around the scaphoid area, no major swelling or erythema  Neurological:     Mental Status: He is alert.    ED Results / Procedures / Treatments   Labs (all labs ordered are listed, but only abnormal results are displayed) Labs Reviewed - No data to display  EKG None  Radiology DG Hand Complete Right  Result Date: 06/27/2021 CLINICAL DATA:  14/11/2020 3 days ago with thumb and wrist pain EXAM: RIGHT HAND - COMPLETE 3+ VIEW COMPARISON:  06/24/2021.  07/26/2018. FINDINGS: No evidence of fracture. Redemonstration of widening of the distance between the scaphoid and lunate suggesting scapholunate ligament tear. No other regional finding. IMPRESSION: Redemonstration of widening of the distance between the scaphoid and lunate suggesting scapholunate ligament tear. This finding is been present since at least 07/26/2018  Electronically Signed   By: Paulina Fusi M.D.   On: 06/27/2021 09:16    Procedures Procedures   Medications Ordered in ED Medications - No data to display  ED Course  I have reviewed the triage vital signs and the nursing notes.  Pertinent labs & imaging results that were available during my care of the patient were reviewed by me and considered in my medical decision making (see chart for details).    MDM Rules/Calculators/A&P                           Kyle Curtis is here with ongoing right hand pain.  Seen here several days ago and diagnosed with injury to the right hand.  Has not been able to follow-up with  orthopedics.  Needs work note as work note expired today.  X-ray redemonstrated the same injury as last x-ray.  However, patient has had this widened distance between the scaphoid and lunate for several years.  He fell recently and landed on outstretched arms again.  He continues to have tenderness in this area but there is no obvious demonstration of a fracture at this time.  He still having discomfort and we will refer him to orthopedics for further work-up.  We will remain in thumb spica splint.  Discharged in good condition.  Neurovascular neuromuscularly intact.  This chart was dictated using voice recognition software.  Despite best efforts to proofread,  errors can occur which can change the documentation meaning.   Final Clinical Impression(s) / ED Diagnoses Final diagnoses:  Hand pain, right    Rx / DC Orders ED Discharge Orders     None        Virgina Norfolk, DO 06/27/21 1840

## 2021-06-27 NOTE — ED Notes (Signed)
Pt. Has a splint on the R lower extremity.  Pt. Reports needing a work note.  No distress noted at time of discharge.  Pt. Has been assessed by EDP.

## 2022-01-04 ENCOUNTER — Encounter (HOSPITAL_BASED_OUTPATIENT_CLINIC_OR_DEPARTMENT_OTHER): Payer: Self-pay | Admitting: Emergency Medicine

## 2022-01-04 ENCOUNTER — Other Ambulatory Visit: Payer: Self-pay

## 2022-01-04 ENCOUNTER — Emergency Department (HOSPITAL_BASED_OUTPATIENT_CLINIC_OR_DEPARTMENT_OTHER): Payer: BC Managed Care – PPO

## 2022-01-04 ENCOUNTER — Emergency Department (HOSPITAL_BASED_OUTPATIENT_CLINIC_OR_DEPARTMENT_OTHER)
Admission: EM | Admit: 2022-01-04 | Discharge: 2022-01-04 | Disposition: A | Discharge status: Home / Self Care | Payer: BC Managed Care – PPO | Attending: Emergency Medicine | Admitting: Emergency Medicine

## 2022-01-04 ENCOUNTER — Other Ambulatory Visit (HOSPITAL_BASED_OUTPATIENT_CLINIC_OR_DEPARTMENT_OTHER): Payer: Self-pay

## 2022-01-04 DIAGNOSIS — Z716 Tobacco abuse counseling: Secondary | ICD-10-CM | POA: Insufficient documentation

## 2022-01-04 DIAGNOSIS — F17290 Nicotine dependence, other tobacco product, uncomplicated: Secondary | ICD-10-CM | POA: Insufficient documentation

## 2022-01-04 DIAGNOSIS — J45909 Unspecified asthma, uncomplicated: Secondary | ICD-10-CM | POA: Insufficient documentation

## 2022-01-04 DIAGNOSIS — Z20822 Contact with and (suspected) exposure to covid-19: Secondary | ICD-10-CM | POA: Insufficient documentation

## 2022-01-04 DIAGNOSIS — J069 Acute upper respiratory infection, unspecified: Secondary | ICD-10-CM | POA: Insufficient documentation

## 2022-01-04 LAB — RESP PANEL BY RT-PCR (RSV, FLU A&B, COVID)  RVPGX2
Influenza A by PCR: NEGATIVE
Influenza B by PCR: NEGATIVE
Resp Syncytial Virus by PCR: NEGATIVE
SARS Coronavirus 2 by RT PCR: NEGATIVE

## 2022-01-04 MED ORDER — OXYMETAZOLINE HCL 0.05 % NA SOLN
1.0000 | Freq: Two times a day (BID) | NASAL | 0 refills | Status: AC
  Filled 2022-01-04: qty 30, 30d supply, fill #0

## 2022-01-04 MED ORDER — IBUPROFEN 600 MG PO TABS
600.0000 mg | ORAL_TABLET | Freq: Four times a day (QID) | ORAL | 0 refills | Status: AC | PRN
  Filled 2022-01-04: qty 30, 8d supply, fill #0

## 2022-01-04 MED ORDER — ACETAMINOPHEN 325 MG PO TABS
650.0000 mg | ORAL_TABLET | Freq: Four times a day (QID) | ORAL | 0 refills | Status: AC | PRN
  Filled 2022-01-04: qty 100, 13d supply, fill #0

## 2022-01-04 MED ORDER — GUAIFENESIN ER 600 MG PO TB12
600.0000 mg | ORAL_TABLET | Freq: Two times a day (BID) | ORAL | 0 refills | Status: AC
  Filled 2022-01-04: qty 20, 10d supply, fill #0

## 2022-01-04 MED ORDER — DEXTROMETHORPHAN POLISTIREX ER 30 MG/5ML PO SUER
15.0000 mg | ORAL | 0 refills | Status: AC | PRN
  Filled 2022-01-04: qty 89, 36d supply, fill #0

## 2022-01-04 NOTE — ED Triage Notes (Signed)
Pt arrives pov, steady gait, c/o cough, sore throat, HA, chills and head and chest congestion starting yesterday.

## 2022-01-04 NOTE — ED Provider Notes (Signed)
[facility] EMERGENCY DEPARTMENT Provider Note   CSN: [id number] Arrival date & time: 01/04/22  0845     History  Chief Complaint  Patient presents with   Cough    Kyle Curtis is a 46 y.o. male.  Patient as above with significant medical history as below, including childhood asthma, tobacco use, who presents to the ED with complaint of postnasal drip, nonproductive cough, congestion, rhinorrhea, body aches.  Onset around 48 hours ago, possible sick contacts at work or at graduation ceremony.  He took TheraFlu yesterday which did make him feel mildly better but made him very sleepy so he went back to bed.  No dyspnea or chest pain.  Tolerant p.o. intake without difficulty.  No change in bowel or bladder function.  No rashes.  No IV drug use, no recent travel.  He smokes cigars daily.    History reviewed. No pertinent past medical history.  History reviewed. No pertinent surgical history.   The history is provided by the patient. No language interpreter was used.  Cough Associated symptoms: sore throat   Associated symptoms: no chest pain, no chills, no fever, no headaches, no rash and no shortness of breath        Home Medications Prior to Admission medications   Medication Sig Start Date End Date Taking? Authorizing Provider  acetaminophen (TYLENOL) 325 MG tablet Take 2 tablets (650 mg total) by mouth every 6 (six) hours as needed. 01/04/22  Yes Sloan Leiter, DO  dextromethorphan (DELSYM) 30 MG/5ML liquid Take 2.5 mLs (15 mg total) by mouth as needed for cough. 01/04/22  Yes Sloan Leiter, DO  guaiFENesin (MUCINEX) 600 MG 12 hr tablet Take 1 tablet (600 mg total) by mouth 2 (two) times daily for 5 days. 01/04/22 01/14/22 Yes Tanda Rockers A, DO  ibuprofen (ADVIL) 600 MG tablet Take 1 tablet (600 mg total) by mouth every 6 (six) hours as needed. 01/04/22  Yes Sloan Leiter, DO  oxymetazoline (AFRIN NASAL SPRAY) 0.05 % nasal spray Place 1 spray into both nostrils 2  (two) times daily for 3 days. 01/04/22 02/03/22 Yes Tanda Rockers A, DO  benzocaine (AMERICAINE) 20 % rectal ointment Place rectally every 4 (four) hours as needed for pain (use sparingling up to six times per day up to one week). 11/04/20   Alvira Monday, MD  docusate sodium (COLACE) 100 MG capsule Take 1 capsule (100 mg total) by mouth 2 (two) times daily. 11/04/20   Alvira Monday, MD  naproxen (NAPROSYN) 500 MG tablet Take 1 tablet (500 mg total) by mouth 2 (two) times daily. 06/24/21   Henderly, Britni A, PA-C  Nitroglycerin 0.4 % OINT Place 1 application rectally 2 (two) times daily as needed (apply pea sized amount twice per day). 11/04/20   Alvira Monday, MD      Allergies    Banana    Review of Systems   Review of Systems  Constitutional:  Negative for chills and fever.  HENT:  Positive for congestion, postnasal drip and sore throat. Negative for facial swelling and trouble swallowing.   Eyes:  Negative for photophobia and visual disturbance.  Respiratory:  Positive for cough. Negative for shortness of breath.   Cardiovascular:  Negative for chest pain and palpitations.  Gastrointestinal:  Negative for abdominal pain, nausea and vomiting.  Endocrine: Negative for polydipsia and polyuria.  Genitourinary:  Negative for difficulty urinating and hematuria.  Musculoskeletal:  Negative for gait problem and joint swelling.  Skin:  Negative for  pallor and rash.  Neurological:  Negative for syncope and headaches.  Psychiatric/Behavioral:  Negative for agitation and confusion.     Physical Exam Updated Vital Signs BP (!) 149/96   Pulse 84   Temp 97.9 F (36.6 C) (Oral)   Resp 18   Ht 5\' 10"  (1.778 m)   Wt 59 kg   SpO2 100%   BMI 18.65 kg/m  Physical Exam Vitals and nursing note reviewed.  Constitutional:      General: He is not in acute distress.    Appearance: He is well-developed.  HENT:     Head: Normocephalic and atraumatic.     Right Ear: External ear normal.      Left Ear: External ear normal.     Mouth/Throat:     Mouth: Mucous membranes are moist.  Eyes:     General: No scleral icterus. Cardiovascular:     Rate and Rhythm: Normal rate and regular rhythm.     Pulses: Normal pulses.     Heart sounds: Normal heart sounds.  Pulmonary:     Effort: Pulmonary effort is normal. No tachypnea, accessory muscle usage or respiratory distress.     Breath sounds: Normal breath sounds. No decreased breath sounds or wheezing.     Comments: No respiratory distress No conversational dyspnea Abdominal:     General: Abdomen is flat.     Palpations: Abdomen is soft.     Tenderness: There is no abdominal tenderness.  Musculoskeletal:        General: Normal range of motion.     Cervical back: Normal range of motion.     Right lower leg: No edema.     Left lower leg: No edema.  Skin:    General: Skin is warm and dry.     Capillary Refill: Capillary refill takes less than 2 seconds.  Neurological:     Mental Status: He is alert and oriented to person, place, and time.  Psychiatric:        Mood and Affect: Mood normal.        Behavior: Behavior normal.     ED Results / Procedures / Treatments   Labs (all labs ordered are listed, but only abnormal results are displayed) Labs Reviewed  RESP PANEL BY RT-PCR (RSV, FLU A&B, COVID)  [id number]    EKG None  Radiology DG Chest Port 1 View  Result Date: 01/04/2022 CLINICAL DATA:  Cough, sore throat, headache, chills EXAM: PORTABLE CHEST 1 VIEW COMPARISON:  Radiograph 03/27/2014 FINDINGS: The heart size and mediastinal contours are within normal limits. Both lungs are clear. The visualized skeletal structures are unremarkable. IMPRESSION: No evidence of acute cardiopulmonary disease. Electronically Signed   By: 05/27/2014 M.D.   On: 01/04/2022 09:15    Procedures Procedures    Medications Ordered in ED Medications - No data to display  ED Course/ Medical Decision Making/ A&P                            Medical Decision Making Amount and/or Complexity of Data Reviewed Radiology: ordered.  Risk OTC drugs. Prescription drug management.    CC: URI symptoms  This patient presents to the Emergency Department for the above complaint. This involves an extensive number of treatment options and is a complaint that carries with it a high risk of complications and morbidity. Vital signs were reviewed. Serious etiologies considered.  DDx includes not limited to pneumonia, viral URI, bacterial URI,  COVID-19, influenza, rhinitis, sinusitis, other acute etiologies were considered  Record review:  Previous records obtained and reviewed  ED visits, prior labs and imaging  Additional history obtained from N/A  Medical and surgical history as noted above.   Work up as above, notable for:  Labs & imaging results that were available during my care of the patient were visualized by me and considered in my medical decision making.   I ordered imaging studies which included chest x-ray. I visualized the imaging, interpreted images, and I agree with radiologist interpretation.  No acute process  Flu/COVID testing negative  Reassessment:  Patient with mild symptoms, he is breathing comfortably on ambient air.  No chest pain or dyspnea.  Tolerant p.o. intake with difficulty, no gait disturbance.  Admission was considered.   Patient with likely URI, discussed supportive care at home.  Low suspicion for bacterial infection.  No dyspnea.  No chest pain.  No pneumonia on imaging             Social determinants of health include -  No pcp Tobacco / cigar use  Counseled patient for approximately 3 minutes regarding smoking cessation. Discussed risks of smoking and how they applied and affected their visit here today. Patient not ready to quit at this time, however will follow up with their primary doctor when they are.   CPT code: 96789: intermediate counseling for smoking  cessation   Social History   Socioeconomic History   Marital status: Single    Spouse name: Not on file   Number of children: Not on file   Years of education: Not on file   Highest education level: Not on file  Occupational History   Not on file  Tobacco Use   Smoking status: Every Day    Types: Cigars   Smokeless tobacco: Never  Vaping Use   Vaping Use: Never used  Substance and Sexual Activity   Alcohol use: No   Drug use: No   Sexual activity: Not on file  Other Topics Concern   Not on file  Social History Narrative   Not on file   Social Determinants of Health   Financial Resource Strain: Not on file  Food Insecurity: Not on file  Transportation Needs: Not on file  Physical Activity: Not on file  Stress: Not on file  Social Connections: Not on file  Intimate Partner Violence: Not on file      This chart was dictated using voice recognition software.  Despite best efforts to proofread,  errors can occur which can change the documentation meaning.         Final Clinical Impression(s) / ED Diagnoses Final diagnoses:  Viral URI with cough    Rx / DC Orders ED Discharge Orders          Ordered    guaiFENesin (MUCINEX) 600 MG 12 hr tablet  2 times daily        01/04/22 0939    oxymetazoline (AFRIN NASAL SPRAY) 0.05 % nasal spray  2 times daily        01/04/22 0939    acetaminophen (TYLENOL) 325 MG tablet  Every 6 hours PRN        01/04/22 0939    ibuprofen (ADVIL) 600 MG tablet  Every 6 hours PRN        01/04/22 0939    dextromethorphan (DELSYM) 30 MG/5ML liquid  As needed        01/04/22 3810  Tanda RockersGray, Brianca Fortenberry A, DO 01/04/22 1014

## 2022-01-04 NOTE — Discharge Instructions (Addendum)
It was a pleasure caring for you today in the emergency department.  Please return to the emergency department for any worsening or worrisome symptoms.  Your [phone] and flu swabs did return negative.

## 2023-05-13 ENCOUNTER — Other Ambulatory Visit: Payer: Self-pay

## 2023-05-13 ENCOUNTER — Emergency Department (HOSPITAL_BASED_OUTPATIENT_CLINIC_OR_DEPARTMENT_OTHER)
Admission: EM | Admit: 2023-05-13 | Discharge: 2023-05-14 | Disposition: A | Discharge status: Home / Self Care | Payer: BLUE CROSS/BLUE SHIELD | Attending: Emergency Medicine | Admitting: Emergency Medicine

## 2023-05-13 ENCOUNTER — Encounter (HOSPITAL_BASED_OUTPATIENT_CLINIC_OR_DEPARTMENT_OTHER): Payer: Self-pay | Admitting: Emergency Medicine

## 2023-05-13 DIAGNOSIS — S161XXA Strain of muscle, fascia and tendon at neck level, initial encounter: Secondary | ICD-10-CM | POA: Insufficient documentation

## 2023-05-13 DIAGNOSIS — Y92410 Unspecified street and highway as the place of occurrence of the external cause: Secondary | ICD-10-CM | POA: Diagnosis not present

## 2023-05-13 DIAGNOSIS — R519 Headache, unspecified: Secondary | ICD-10-CM | POA: Insufficient documentation

## 2023-05-13 DIAGNOSIS — S199XXA Unspecified injury of neck, initial encounter: Secondary | ICD-10-CM | POA: Diagnosis present
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
## 2023-05-14 ENCOUNTER — Emergency Department (HOSPITAL_BASED_OUTPATIENT_CLINIC_OR_DEPARTMENT_OTHER): Payer: BLUE CROSS/BLUE SHIELD

## 2023-05-14 MED ORDER — ACETAMINOPHEN 500 MG PO TABS
1000.0000 mg | ORAL_TABLET | Freq: Once | ORAL | Status: AC
  Administered 2023-05-14: 1000 mg via ORAL
  Filled 2023-05-14: qty 2

## 2023-05-14 NOTE — Discharge Instructions (Addendum)
Your CT imaging was negative for acute fracture or dislocation in your cervical spine, no evidence of acute intracranial abnormality.  Recommend Tylenol and ibuprofen for headache and pain control.  Your neck pain may be worse tomorrow before it gets better

## 2023-05-14 NOTE — ED Notes (Signed)
Pt is in bathroom
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
## 2024-01-31 IMAGING — DX DG CHEST 1V PORT
1 series · 1 of 1 positions shown · non-contrast
Comparison: Radiograph 03/27/2014

CLINICAL DATA: Cough, sore throat, headache, chills

EXAM:
PORTABLE CHEST 1 VIEW

[chest ap]
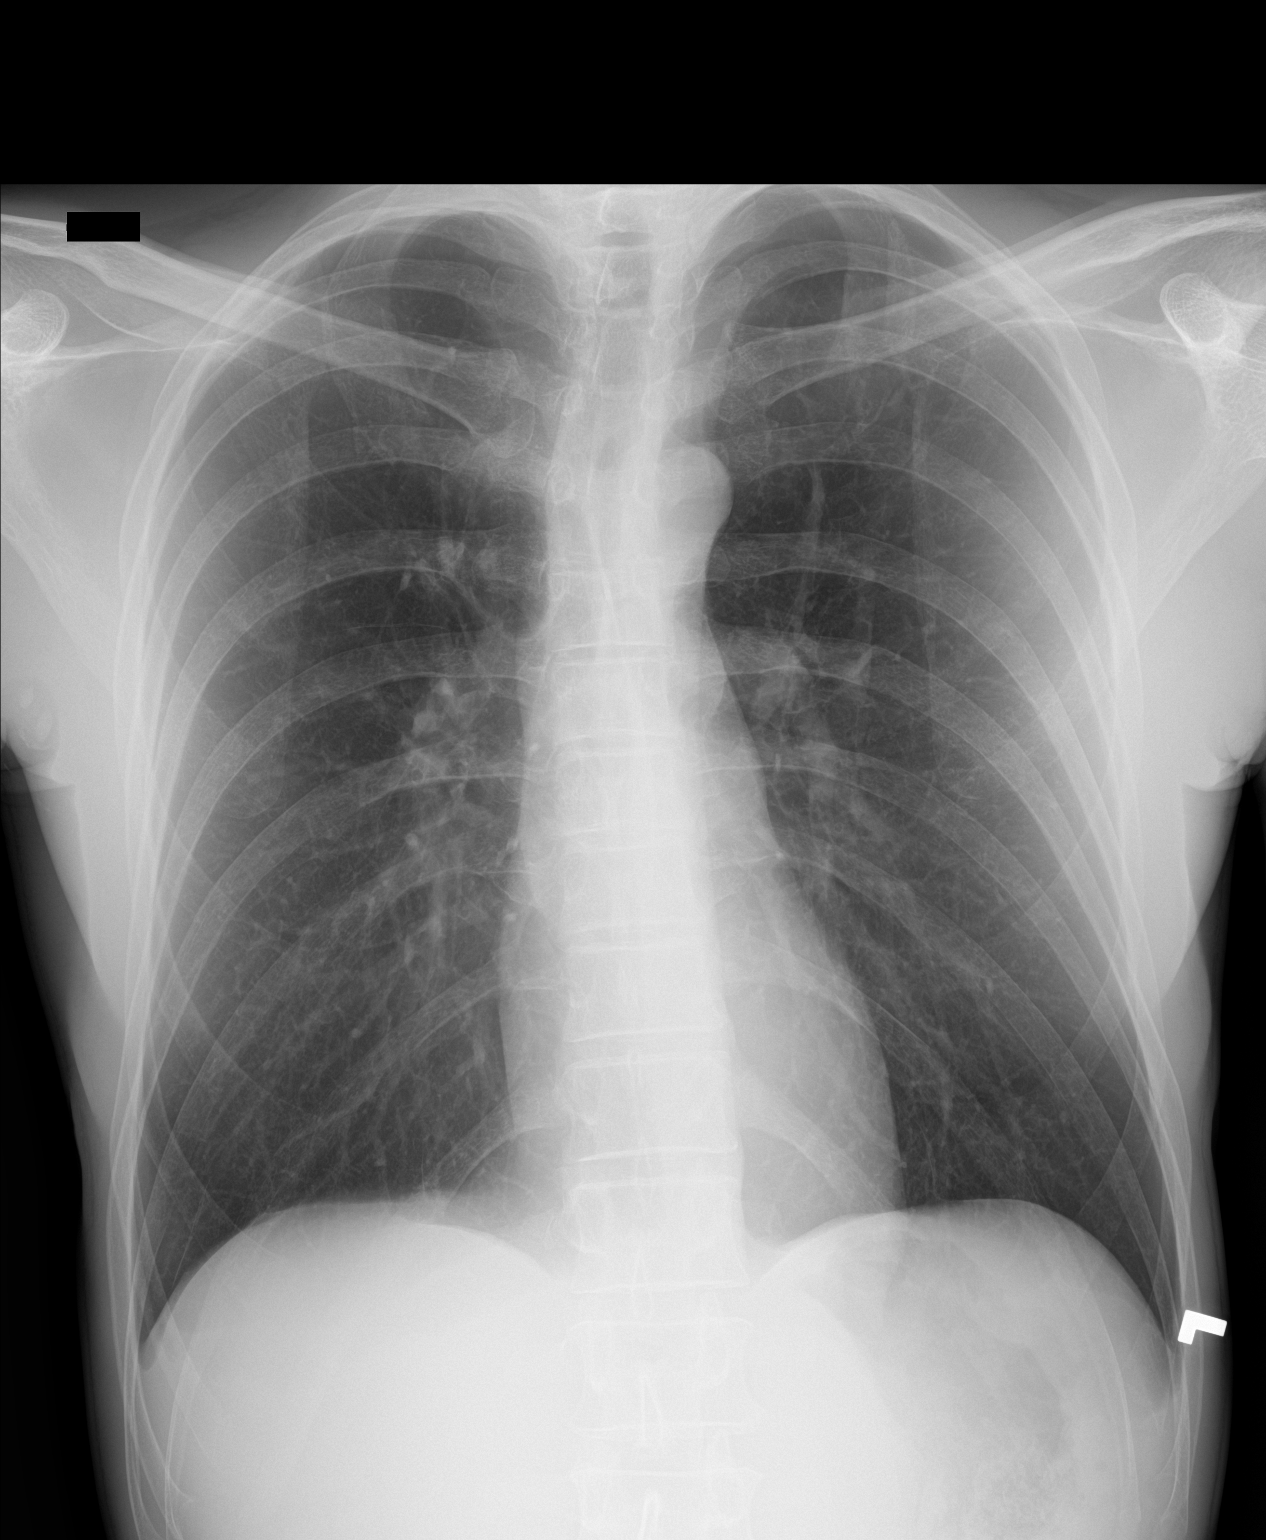

[1 of 1 positions shown; findings below may reference images not displayed]

FINDINGS: The heart size and mediastinal contours are within normal limits.
Both lungs are clear. The visualized skeletal structures are
unremarkable.
IMPRESSION: No evidence of acute cardiopulmonary disease.
